# Patient Record
Sex: Male | Born: 1935 | Race: White | Hispanic: No | Marital: Married | Smoking: Former smoker
Health system: Southern US, Community
[De-identification: ages and names within clinical notes are randomized; demographics above are authoritative.]

## PROBLEM LIST (undated history)

## (undated) DIAGNOSIS — K648 Other hemorrhoids: Secondary | ICD-10-CM

## (undated) DIAGNOSIS — I2510 Atherosclerotic heart disease of native coronary artery without angina pectoris: Secondary | ICD-10-CM

## (undated) DIAGNOSIS — D509 Iron deficiency anemia, unspecified: Secondary | ICD-10-CM

## (undated) DIAGNOSIS — K449 Diaphragmatic hernia without obstruction or gangrene: Secondary | ICD-10-CM

## (undated) DIAGNOSIS — N400 Enlarged prostate without lower urinary tract symptoms: Secondary | ICD-10-CM

## (undated) DIAGNOSIS — K5790 Diverticulosis of intestine, part unspecified, without perforation or abscess without bleeding: Secondary | ICD-10-CM

## (undated) DIAGNOSIS — N529 Male erectile dysfunction, unspecified: Secondary | ICD-10-CM

## (undated) DIAGNOSIS — I10 Essential (primary) hypertension: Secondary | ICD-10-CM

## (undated) DIAGNOSIS — K219 Gastro-esophageal reflux disease without esophagitis: Secondary | ICD-10-CM

## (undated) DIAGNOSIS — E785 Hyperlipidemia, unspecified: Secondary | ICD-10-CM

## (undated) DIAGNOSIS — K222 Esophageal obstruction: Secondary | ICD-10-CM

## (undated) DIAGNOSIS — E291 Testicular hypofunction: Secondary | ICD-10-CM

## (undated) DIAGNOSIS — J189 Pneumonia, unspecified organism: Secondary | ICD-10-CM

## (undated) DIAGNOSIS — D472 Monoclonal gammopathy: Secondary | ICD-10-CM

## (undated) DIAGNOSIS — M1990 Unspecified osteoarthritis, unspecified site: Secondary | ICD-10-CM

## (undated) HISTORY — DX: Benign prostatic hyperplasia without lower urinary tract symptoms: N40.0

## (undated) HISTORY — PX: INGUINAL HERNIA REPAIR: SUR1180

## (undated) HISTORY — DX: Male erectile dysfunction, unspecified: N52.9

## (undated) HISTORY — PX: TONSILLECTOMY: SUR1361

## (undated) HISTORY — DX: Other hemorrhoids: K64.8

## (undated) HISTORY — DX: Unspecified osteoarthritis, unspecified site: M19.90

## (undated) HISTORY — DX: Iron deficiency anemia, unspecified: D50.9

## (undated) HISTORY — DX: Diverticulosis of intestine, part unspecified, without perforation or abscess without bleeding: K57.90

## (undated) HISTORY — DX: Testicular hypofunction: E29.1

## (undated) HISTORY — DX: Monoclonal gammopathy: D47.2

## (undated) HISTORY — PX: BASAL CELL CARCINOMA EXCISION: SHX1214

## (undated) HISTORY — DX: Diaphragmatic hernia without obstruction or gangrene: K44.9

## (undated) HISTORY — DX: Essential (primary) hypertension: I10

## (undated) HISTORY — DX: Atherosclerotic heart disease of native coronary artery without angina pectoris: I25.10

## (undated) HISTORY — PX: APPENDECTOMY: SHX54

## (undated) HISTORY — DX: Gastro-esophageal reflux disease without esophagitis: K21.9

## (undated) HISTORY — DX: Esophageal obstruction: K22.2

## (undated) HISTORY — DX: Hyperlipidemia, unspecified: E78.5

## (undated) HISTORY — DX: Pneumonia, unspecified organism: J18.9

---

## 1993-10-06 HISTORY — PX: CORONARY ARTERY BYPASS GRAFT: SHX141

## 2004-07-16 ENCOUNTER — Inpatient Hospital Stay (HOSPITAL_BASED_OUTPATIENT_CLINIC_OR_DEPARTMENT_OTHER): Admission: RE | Admit: 2004-07-16 | Discharge: 2004-07-16 | Payer: Self-pay | Admitting: Cardiology

## 2008-08-24 ENCOUNTER — Encounter (HOSPITAL_BASED_OUTPATIENT_CLINIC_OR_DEPARTMENT_OTHER): Payer: Self-pay | Admitting: Internal Medicine

## 2008-08-24 ENCOUNTER — Inpatient Hospital Stay (HOSPITAL_COMMUNITY): Admission: EM | Admit: 2008-08-24 | Discharge: 2008-08-25 | Payer: Self-pay | Admitting: Emergency Medicine

## 2008-08-24 ENCOUNTER — Ambulatory Visit: Payer: Self-pay | Admitting: Surgery

## 2009-01-16 ENCOUNTER — Ambulatory Visit: Payer: Self-pay | Admitting: Gastroenterology

## 2010-02-28 ENCOUNTER — Telehealth (INDEPENDENT_AMBULATORY_CARE_PROVIDER_SITE_OTHER): Payer: Self-pay

## 2010-11-07 NOTE — Progress Notes (Signed)
Summary: Schedule overdue recall colonoscopy  Phone Note Outgoing Call Call back at [facility] Phone [phone]   Call placed by: Darcey Nora RN, CGRN,  Feb 28, 2010 10:41 AM Call placed to: Patient Summary of Call: Patient was contacted about over due recall colonoscopy.  Patient  is aware he is due for a colon, but declines to schedule at this time.  He states he will call back if he is interested in scheduling.  He is given my name and number. Initial call taken by: Darcey Nora RN, CGRN,  Feb 28, 2010 10:43 AM

## 2011-02-18 NOTE — H&P (Signed)
NAMELEVORN, OLESKI NO.:  [id number]   MEDICAL RECORD NO.:  [id number]          PATIENT TYPE:  INP   LOCATION:  2016                         FACILITY:  [facility]   PHYSICIAN:  Barry Dienes. [name], M.D.DATE OF BIRTH:  28-Nov-1931   DATE OF ADMISSION:  08/23/2008  DATE OF DISCHARGE:                              HISTORY & PHYSICAL   CHIEF COMPLAINT:  Intermittent confusion.   HISTORY OF PRESENT ILLNESS:  The patient is a 77 year old right-handed  white man who yesterday had a period of confusion at church.  Around 7  o'clock continuing to about 8 p.m. he had slurred speech and was unable  to walk.  He recalls little of this event.  He did not have a loss of  consciousness or palpitations or chest pain.  He has no history of  stroke or transient ischemic attacks.  At the time of my evaluation,  several hours later he feels at his baseline.  He has had a mild  headache for the past few hours.   PAST MEDICAL HISTORY:  1. Hypertension.  2. Diabetes mellitus, type 2.  3. Coronary artery disease status post 1995 coronary artery bypass      graft x7.  4. Hyperlipidemia with statin intolerance.  5. Gastroesophageal reflux disease.   MEDICATIONS PRIOR TO ADMISSION:  (Note the doses were not available)  1. Altace 1 tablet daily.  2. Zetia 10 mg daily.  3. Actos 1 tablet daily.  4. Metformin daily.  5. Nexium 40 mg daily.   ALLERGIES:  No known drug allergies.   PAST SURGICAL HISTORY:  1. Remote tonsillectomy and adenoidectomy.  2. Bilateral inguinal hernia repair, 1995.  3. Coronary artery bypass graft x7.  4. Excision of basal cell carcinoma of the nose, 2005.  5. Cardiac catheterization done which showed severe 3-vessel native      coronary artery disease, patent saphenous vein graft to the first      and second obtuse marginal branches, patent LIMA graft to the LAD      and diagonal vessels, occluded saphenous vein graft to the right      coronary artery,  however, the right coronary system is supplied by      left-to-right collaterals, and normal left ventricular systolic      function for which medical management was recommended.   FAMILY HISTORY:  Mother died at age 68 of cancer.  Father died at age 77  of myocardial infarction.   SOCIAL HISTORY:  He is married and has 3 children.  He has worked as a  Recruitment consultant.  He stopped tobacco use approximately 14 years ago  and has no history of alcohol abuse.   REVIEW OF SYSTEMS:  Currently, he has mild headache with very mild  shortness of breath.  He denies fever, chills, productive cough, chest  pain or palpitations, nausea, vomiting, diarrhea, constipation,  arthritis, anxiety, or depression.  There are mild symptoms of benign  prostatic hypertrophy.   PHYSICAL EXAMINATION:  VITAL SIGNS:  Blood pressure 151/86, pulse 90,  respiratory rate 18, temperature 97.2, and pulse  oxygen saturation 95%  on room air.  GENERAL:  He is an elderly white man who is in no apparent distress  while sitting upright in bed.  HEAD, EYES, EARS, NOSE, AND THROAT:  Within normal limits.  NECK:  Supple without jugular venous distention or carotid bruit.  CHEST:  Clear to auscultation.  HEART:  Regular rate and rhythm without significant murmur or gallop.  ABDOMEN:  Normal bowel sounds and no hepatosplenomegaly or tenderness.  EXTREMITIES:  Without cyanosis, clubbing, or edema.  NEUROLOGIC:  He was alert and oriented x4 with a normal affect.  Cranial  nerves II-XII were normal.  Light touch sensation was intact throughout.  He had normal motor strength in all 4 limbs.  He had intact finger-to-  nose testing bilaterally.  Gait was not assessed due to his attachment  to IVs in telemetry.   LABORATORY STUDIES:  EKG showed the following:  1. Normal sinus rhythm.  2. First-degree AV block.  3. Nonspecific intraventricular conduction delay.   Urinalysis was nitrite negative.  Urine drug screen was normal.   Serum  salicylate, ethanol, and acetaminophen levels were normal.  Serum sodium  135, potassium 3.8, chloride 98, carbon dioxide 21, BUN 16, creatinine  0.92, and glucose 173.  Troponin I was less than 0.05.  White blood cell  count was 11.1, hemoglobin 12, hematocrit 35, and platelets 276.  Chest  x-ray showed status post median sternotomy, hiatal hernia, and right  upper lobe pulmonary infiltrate.  A CT scan of the head without IV  contrast showed evidence of left thalamus ischemia with remote left  middle cerebral artery distribution ischemia.   IMPRESSION AND PLAN:  1. Altered mental status:  Likely secondary to a transient ischemic      attack.  Currently, his symptoms have resolved entirely.  The      etiology could be a transient arrhythmia.  I doubt that he has an      intracardiac source of ischemia or significant carotid artery      disease.  I plan to check a transthoracic echocardiogram, carotid      ultrasound, and a brain MRI exam with MRA exam.  If this workup is      unremarkable, he may be discharged home as early as tomorrow.  2. Pneumonia:  He has virtually no symptoms of his possible early      right upper lobe pneumonia and is stable on      Rocephin with Zithromax.  3. Diabetes mellitus, type 2:  Reportedly well controlled on metformin      with Actos.  In addition, we will add NovoLog sliding scale insulin      if blood glucose levels rise greater than 250.           ______________________________  Barry Dienes. [name], M.D.     DGP/MEDQ  D:  08/24/2008  T:  08/25/2008  Job:  [id number]   cc:   Gwen Pounds, MD  Peter M. [place], M.D.

## 2011-02-21 NOTE — Cardiovascular Report (Signed)
NAME:  Gabriel Lewis, ROMER NO.:  [id number]   MEDICAL RECORD NO.:  [id number]          PATIENT TYPE:  OIB   LOCATION:  6501                         FACILITY:  [facility]   PHYSICIAN:  Peter M. [place], M.D.  DATE OF BIRTH:  [birth year]   DATE OF PROCEDURE:  07/16/2004  DATE OF DISCHARGE:                              CARDIAC CATHETERIZATION   INDICATIONS FOR PROCEDURE:  A 77 year old male with history of diabetes,  hypertension, hyperlipidemia. History of coronary artery bypass surgery 10  years ago now presents with recurrent angina.  Stress Cardiolite study  demonstrates inferolateral ischemia.   PROCEDURES:  Left heart catheterization of coronary, left ventricular  angiography, saphenous vein graft and LIMA graft angiography.  Access would  be the right femoral artery using standard Seldinger technique.   EQUIPMENT:  Used 4-French 4 cm right and left Judkins catheter, a 4-French  pigtail catheter, a 4-French LCV catheter and 4-French LIMA catheter and a 4-  French arterial sheath.   MEDICATIONS:  1.  Local anesthesia 1% Xylocaine.  2.  Contrast:  150 cc of Omnipaque.   HEMODYNAMIC DATA:  Aortic pressure 147/76 with mean of 107, left ventricular  pressure 149/with EDP of 15 mmHg.   ANGIOGRAPHIC DATA:  1.  The left coronary artery rises and distributes normally.  The left main      coronary artery appears normal.  The left anterior descending artery is      occluded after the takeoff of first septal perforator.  2.  The left circumflex coronary is occluded proximally.  3.  The right coronary artery is occluded proximally.  4.  The saphenous vein graft to the right coronary artery is occluded      proximally.  5.  Saphenous vein graft to the first and second obtuse marginal vessels is      widely patent.  There is mild 20-30% disease in the distal vein graft.      There is excellent runoff.  6.  The LIMA graft to LAD is widely patent.  Of note, there are excellent    left and right collaterals both from the marginal vessels and the LAD to      the right coronary artery.  7.  Left ventricular angiography demonstrates normal left ventricular size      with inferior basal hypokinesia.  Overall ejection fraction is estimated      at 55%.  There is no mitral regurgitation or prolapse.   FINAL INTERPRETATION:  1.  Severe three vessel occlusive atherosclerotic coronary artery disease.  2.  Patent saphenous vein graft to the first and second obtuse marginal      vessels.  3.  Patent LIMA graft to LAD and diagonal vessels.  4.  Occluded saphenous vein graft to the right coronary artery.  The right      coronary, however, is supplied by good      left to right collaterals.  5.  Normal left ventricular function.   PLAN:  Recommend continue medical therapy.       PMJ/MEDQ  D:  07/16/2004  T:  07/16/2004  Job:  [id number]   cc:   Gwen Pounds, M.D.  Fax: [phone]

## 2011-02-21 NOTE — Discharge Summary (Signed)
Gabriel Lewis, CROSWELL NO.:  [id number]   MEDICAL RECORD NO.:  [id number]          PATIENT TYPE:  INP   LOCATION:  2016                         FACILITY:  [facility]   PHYSICIAN:  Barry Dienes. [name], M.D.DATE OF BIRTH:  26-Nov-1931   DATE OF ADMISSION:  08/23/2008  DATE OF DISCHARGE:  08/25/2008                               DISCHARGE SUMMARY   PERTINENT FINDINGS:  The patient is a 77 year old right-handed white man  who on the day prior to admission had a period of confusion at church.  Around 7 o'clock, continuing until about 8 p.m., he had slurred speech  and was unable to walk.  He recalled little of this event.  He did not  have a loss of consciousness or palpitations or chest pain.  He has no  history of stroke or transient ischemic attacks.  At the time of my  evaluation, he felt back at his baseline.  He notes that he has also had  a mild headache for the past few hours.   PAST MEDICAL HISTORY:  1. Hypertension.  2. Diabetes mellitus, type 2.  3. Coronary artery disease, status post 1995 CABG x7.  4. Hyperlipidemia with statin intolerance.  5. Gastroesophageal reflux disease.   MEDICATIONS PRIOR TO ADMISSION:  1. Altace 1 tablet daily.  2. Zetia 10 mg daily.  3. Actos 1 tablet daily.  4. Metformin daily.  5. Nexium 40 mg daily.  6. Aspirin 81 mg occasionally.   ALLERGIES:  No known drug allergies.   PHYSICAL EXAMINATION:  VITAL SIGNS:  Blood pressure 151/86, pulse 90,  respirations 18, temperature 97.2, and pulse oxygen saturation 95% on  room air.  GENERAL:  He is an elderly white man who was in no apparent distress  while sitting upright in bed.  HEAD, EYES, EARS, NOSE, AND THROAT:  Within normal limits without facial  droop.  NECK:  Supple without jugular venous distention or carotid bruit.  CHEST:  Clear to auscultation.  HEART:  Irregular rate and rhythm without significant murmur or gallop.  ABDOMEN:  Normal bowel sounds and no  hepatosplenomegaly or tenderness.  EXTREMITIES:  Without cyanosis, clubbing, or edema and the pedal pulses  were normal.  NEUROLOGIC:  He was alert and oriented x4 with a normal affect.  Cranial  nerves II through XII were normal, light touch sensation was intact  throughout.  He had normal motor strength in all limbs.  He had intact  finger-to-nose testing bilaterally.  Gait was not assessed initially.   INITIAL LABORATORY STUDIES:  EKG showed the following:  1. Normal sinus rhythm.  2. First-degree AV block,  3. Nonspecific intraventricular conduction delay.   Urinalysis was nitrite negative.  Urine drug screen was normal.  Serum  salicylate, ethanol, and acetaminophen levels were normal.  Serum sodium  135, potassium 3.8, chloride 98, carbon dioxide 21, BUN 16, creatinine  0.92, and glucose 173.  Troponin I less than 0.05.  White blood cell  count 11.1, hemoglobin 12, hematocrit 35, and platelets 276.  Chest x-  ray showed status post median sternotomy, hiatal hernia, and  early right  upper lobe pulmonary infiltrate.  A CT scan of the head without IV  contrast showed evidence of left thalamus ischemia with remote left  middle cerebral artery distribution ischemia.   HOSPITAL COURSE:  The patient was admitted to a medical bed with  telemetry.  He also was started on Rocephin and Zithromax for mild  community-acquired pneumonia.  On telemetry, he showed no significant  arrhythmia.  A transthoracic echocardiogram showed normal left  ventricular size and systolic function with an ejection fraction of  approximately 60%.  He had mild concentric hypertrophy and no evidence  of valvular heart disease.  He also had a MRA of the head without  contrast that showed no occlusions, stenoses, dissections, or aneurysms  in the circle of Willis and main cerebral artery.  A head MRI scan  without and with contrast showed no evidence of acute ischemia, mild  diffuse atrophy, nonspecific subcortical  white matter changes  supratentorially likely due to ischemic gliosis due to small vessel  disease, and less likely due to a demyelinating illness or vasculitis,  mild inflammatory thickening of the mucosa in the ethmoid sinuses and  maxillary sinuses.  A CT scan of the head without contrast showed a  subtle hypodensity in the left thalamus compatible with age  indeterminate small vessel ischemia and left insula and operculum  encephalomalacia, probably due to remote left middle cerebral artery  distribution ischemia.  He also had a carotid ultrasound exam with  preliminary report showing no evidence of significant stenoses.   COMPLICATIONS:  None.   CONDITION ON DISCHARGE:  He felt fine and not had any further transient  ischemic attacks since admission.  He has been eating well and has not  had shortness of breath, chest pain or palpitations, or nausea.   PHYSICAL EXAMINATION:  VITAL SIGNS:  Blood pressure 133/84, pulse 72,  respirations 22, temperature 97.7, and pulse oxygen saturation 96% on  room air.  GENERAL:  He is a well-nourished, well-developed white male who is in no  apparent distress.  CHEST:  Clear to auscultation.  HEART:  Regular rate and rhythm.  NEUROLOGIC:  Nonfocal.  He was able to change from a sitting position to  a standing position and walk in his room and turnaround and return to a  seated position without any difficulty.   DISCHARGE DIAGNOSES:  1. Transient ischemic attack without evidence of acute stroke.  2. Right upper lobe pneumonia.  3. Hyperlipidemia.  4. Hypertension.  5. Diabetes mellitus, type 2, controlled.  6. Gastroesophageal reflux disease.  7. Coronary artery disease.   DISCHARGE MEDICATIONS:  1. Zetia 10 mg p.o. daily.  2. Altace 2.5 mg p.o. daily.  3. Norvasc 2.5 mg p.o. daily.  4. Metformin ER 500 mg p.o. b.i.d.  5. Fish oil 1200 mg p.o. t.i.d.  6. Nexium 40 mg p.o. daily.  7. Toprol 1 tablet daily.  8. Ecotrin 325 mg p.o.  daily.  9. Doxycycline 100 mg p.o. b.i.d. for 10 days.   DISPOSITION AND FOLLOWUP:  The patient will be discharged to home with  his wife.  He is to see his primary physician, Dr. Creola Corn, next week  at [facility] as previously planned.           ______________________________  Barry Dienes. [name], M.D.     DGP/MEDQ  D:  08/27/2008  T:  08/28/2008  Job:  [id number]

## 2011-02-21 NOTE — H&P (Signed)
NAME:  Gabriel Lewis, Gabriel Lewis NO.:  [id number]   MEDICAL RECORD NO.:  [id number]          PATIENT TYPE:  AMB   LOCATION:                               FACILITY:  [facility]   PHYSICIAN:  Peter M. [place], M.D.  DATE OF BIRTH:  08-May-1932   DATE OF ADMISSION:  07/16/2004  DATE OF DISCHARGE:                                HISTORY & PHYSICAL   HISTORY OF PRESENT ILLNESS:  Gabriel Lewis is a 77 year old white male with a  history of coronary artery disease.  He is status post coronary artery  bypass surgery x 7 in 1995 by Dr. Andrey Campanile.  He had had a negative Cardiolite  study in 2003.  He presents with a 2 to 3 week history of left elbow pain.  He awoke one morning with fairly severe elbow pain but this subsequently  eased off.  He has had no nausea, vomiting, diaphoresis, shortness of  breath, or any significant chest pain.  He subsequently underwent a stress  Cardiolite study on July 09, 2004.  He was able to walk eight and a half  minutes on the Bruce protocol.  He did develop typical left elbow pain and  left chest tightness, occasional PVCs were noted.  He had no ST segment  changes.  Cardiolite images demonstrated basal inferior lateral wall  ischemia.  Left ventricular function was overall normal with an ejection  fraction of 64%.  This ischemic abnormality was new compared to 2003.  The  patient is now admitted for cardiac catheterization.   PAST MEDICAL HISTORY:  1.  Hyperlipidemia.  2.  Hypertension.  3.  Borderline diabetes mellitus.  4.  He has had prior appendectomy.  5.  Tonsillectomy.  6.  Adenoidectomy.  7.  Bilateral hernia repair.  8.  Previous coronary bypass surgery.   CURRENT MEDICATIONS:  1.  Altace 2.5 mg daily.  2.  Zetia 10 mg per day.  3.  Aspirin 81 mg per day.  4.  Avandia 4 mg per day.  5.  Welchol 625 mg, six tablets a day.  6.  Vision formula once a day.  7.  Prilosec 20 mg per day.  8.  Ditropan 5 mg p.r.n.   ALLERGIES:  The patient is  intolerant to MULTIPLE STATINS due to myalgias.   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  He is  married.  He has three children.  He quit smoking ten years ago at the time  of his bypass surgery.  He denies alcohol use.   FAMILY HISTORY:  Father died at age 85 with myocardial infarction.  Mother  died age 77 with cancer.  He has a sister who is alive and well.   REVIEW OF SYSTEMS:  The patient has had no TIA or CVA symptoms.  No nausea,  vomiting.  Bowels have been regular.  He has had on kidney dysfunction.  No  edema, orthopnea, or PND.  Other review of systems are negative.   PHYSICAL EXAMINATION:  GENERAL:  The patient is a pleasant white male in no  distress.  VITAL SIGNS:  Weight is 193, blood pressure is 140/80, pulse 88 and regular,  respirations are normal.  HEENT:  Unremarkable.  He has no JVD, adenopathy, thyromegaly, or bruits.  LUNGS:  Clear.  CARDIAC:  Reveals a regular rate and rhythm.  Normal S1 and S2 without  gallop, murmur, rub, or click.  He has an old median sternotomy scar.  ABDOMEN:  Abdomen is soft and nontender without masses, bruits, or  hepatosplenomegaly.  EXTREMITIES:  Without edema.  Pulses are 2+ and symmetric.  NEUROLOGIC:  Nonfocal.   LABORATORY DATA:  Chest x-ray shows chronic postoperative change from prior  bypass surgery.  No active disease.   ECG shows normal sinus rhythm with a right bundle branch block.   IMPRESSION:  1.  Recurrent angina pectoris.  2.  Atherosclerotic coronary artery disease, status post coronary artery      bypass graft times seven in 1995.  Bypass grafts at that time included:      left internal mammary artery graft sequentially to the left anterior      descending artery and diagonal; a sequential saphenous vein graft to the      acute marginal, posterior descending, and posterior lateral branches of      the right coronary artery; and a saphenous vein graft sequential to the       circumflex and obtuse marginal vessel.  3.  Hypertension.  4.  Diabetes mellitus, type 2.  5.  Hypercholesterolemia.  6.  History of intolerance to statin drugs.   PLAN:  Will undergo cardiac catheterization with further therapy pending  these results.        ___________________________________________  Peter M. [place], M.D.    PMJ/MEDQ  D:  07/12/2004  T:  07/12/2004  Job:  [id number]   cc:   Gwen Pounds, M.D.  Fax: [phone]

## 2011-04-02 ENCOUNTER — Encounter: Payer: Self-pay | Admitting: Gastroenterology

## 2011-04-02 ENCOUNTER — Ambulatory Visit (INDEPENDENT_AMBULATORY_CARE_PROVIDER_SITE_OTHER): Payer: Medicare Other | Admitting: Gastroenterology

## 2011-04-02 VITALS — BP 134/72 | HR 80 | Ht 69.0 in | Wt 185.0 lb

## 2011-04-02 DIAGNOSIS — D509 Iron deficiency anemia, unspecified: Secondary | ICD-10-CM

## 2011-04-02 DIAGNOSIS — K219 Gastro-esophageal reflux disease without esophagitis: Secondary | ICD-10-CM

## 2011-04-02 MED ORDER — PEG-KCL-NACL-NASULF-NA ASC-C 100 G PO SOLR: 1.0000 | Freq: Once | ORAL | Status: DC

## 2011-04-02 NOTE — Patient Instructions (Signed)
Go directly to the basement to have your labs drawn today.  You have been scheduled for a Upper Endoscopy/ Colonoscopy with propofol. Separate instructions given. Pick up your prep from your pharmacy.  cc: Creola Corn, MD

## 2011-04-02 NOTE — Progress Notes (Signed)
History of Present Illness: This is a 77 year old male here today with his wife. He is referred by Dr. Timothy Lasso for a recurrent microcytic anemia, presumed to be iron deficiency. He has a history of iron deficiency anemia about 35 years ago when he was followed by Dr. Jeanie Sewer. I saw him for iron deficiency anemia in 2005. His colonoscopy was unremarkable, except for fair bowel preparation, and he had an esophageal stricture, a gastric ulcer, duodenitis, a hiatal hernia and Cameron erosions. Gastric ulcer was documented to heal on a followup endoscopy in 3 months later. His reflux symptoms are under good control on daily Prilosec. He has no gastrointestinal complaints except for the recent onset of constipation that he relates to [employer]. His constipation has improved since he discontinued WelChol and start MiraLax. Recent hemoglobin was 10.9 with an MCV of 75.6. He denies change in stool caliber, melena, hematochezia, weight loss, chest pain, abdominal pain, nausea, vomiting, dysphagia.  Past Medical History  Diagnosis Date  . Diverticulosis   . Internal hemorrhoids   . Gallstones   . Diabetes mellitus   . CAD (coronary artery disease)   . Hypertension   . BPH (benign prostatic hyperplasia)   . ED (erectile dysfunction)   . Hyperlipidemia   . Arthritis   . Iron deficiency anemia   . Esophageal stricture   . Hiatal hernia   . Gastric ulcer   . Hypogonadism male   . TIA (transient ischemic attack)   . Pneumonia   . GERD (gastroesophageal reflux disease)    Past Surgical History  Procedure Date  . Appendectomy   . Tonsillectomy   . Coronary artery bypass graft   . Inguinal hernia repair     Bilateral  . Basal cell carcinoma excision     reports that he quit smoking about 17 years ago. He has never used smokeless tobacco. He reports that he does not drink alcohol or use illicit drugs. family history includes Heart attack in his father.  There is no history of Colon cancer. No Known  Allergies  Outpatient Encounter Prescriptions as of 04/02/2011  Medication Sig Dispense Refill  . amLODipine (NORVASC) 5 MG tablet Take 5 mg by mouth daily.        Marland Kitchen arginine 500 MG tablet Take 500 mg by mouth daily.        Marland Kitchen aspirin 325 MG tablet Take 325 mg by mouth daily.        . Cholecalciferol (VITAMIN D3) 1000 UNITS CAPS Take 1 capsule by mouth daily.        . Cinnamon 500 MG capsule Take 500 mg by mouth daily.        Marland Kitchen ezetimibe (ZETIA) 10 MG tablet Take 10 mg by mouth daily.        . fish oil-omega-3 fatty acids 1000 MG capsule Take 2 g by mouth 3 (three) times daily.        . Garlic TABS Take 1 tablet by mouth daily.        . Melatonin 3 MG CAPS Take 1 capsule by mouth daily.        . metFORMIN (GLUCOPHAGE) 500 MG tablet Take 1,000 mg by mouth 2 (two) times daily with a meal.        . Multiple Vitamins-Minerals (VISION FORMULA) TABS Take 1 tablet by mouth daily.        Marland Kitchen omeprazole (PRILOSEC) 20 MG capsule Take 20 mg by mouth daily.        Marland Kitchen oxybutynin (  DITROPAN-XL) 5 MG 24 hr tablet Take 5 mg by mouth. As needed      . pioglitazone (ACTOS) 30 MG tablet Take 30 mg by mouth daily.        . polyethylene glycol powder (MIRALAX) powder Take 17 g by mouth. As needed       . Tamsulosin HCl (FLOMAX) 0.4 MG CAPS Take 0.4 mg by mouth daily.        Marland Kitchen zolpidem (AMBIEN) 5 MG tablet Take 5 mg by mouth at bedtime as needed.        . colesevelam (WELCHOL) 625 MG tablet Take 1,875 mg by mouth 2 (two) times daily with a meal.        . peg 3350 powder (MOVIPREP) 100 G SOLR Take 1 kit (100 g total) by mouth once.  1 kit  0    Review of Systems: Pertinent positive and negative review of systems were noted in the above HPI section. All other review of systems were otherwise negative.  Physical Exam: General: Well developed , well nourished, no acute distress Head: Normocephalic and atraumatic Eyes:  sclerae anicteric, EOMI Ears: Normal auditory acuity Mouth: No deformity or lesions Neck: Supple,  no masses or thyromegaly Lungs: Clear throughout to auscultation Heart: Regular rate and rhythm; no murmurs, rubs or bruits Abdomen: Soft, non tender and non distended. No masses, hepatosplenomegaly or hernias noted. Normal Bowel sounds Rectal: Deferred to colonoscopy Musculoskeletal: Symmetrical with no gross deformities  Skin: No lesions on visible extremities Pulses:  Normal pulses noted Extremities: No clubbing, cyanosis, edema or deformities noted Neurological: Alert oriented x 4, grossly nonfocal Cervical Nodes:  No significant cervical adenopathy Inguinal Nodes: No significant inguinal adenopathy Psychological:  Alert and cooperative. Normal mood and affect  Assessment and Recommendations:  1. Recurrent iron deficiency anemia. I suspect he has chronic losses from [facility] erosions related to his hiatal hernia. This is a chronic condition which may cause iron deficiency intermittently. Obtain iron studies and celiac disease antibodies. Rule out colorectal neoplasms, ulcer disease, AVMs and other disorders. Schedule colonoscopy and upper endoscopy. The risks, benefits, and alternatives to colonoscopy with possible biopsy and possible polypectomy were discussed with the patient and they consent to proceed. The risks, benefits, and alternatives to endoscopy with possible biopsy and possible dilation were discussed with the patient and they consent to proceed.   2. GERD with a history of peptic stricture. Omeprazole 20 mg daily.  3. History of gastric ulcers. Continue omeprazole 20 mg daily.

## 2011-04-03 ENCOUNTER — Encounter: Payer: Self-pay | Admitting: Gastroenterology

## 2011-04-03 ENCOUNTER — Other Ambulatory Visit (INDEPENDENT_AMBULATORY_CARE_PROVIDER_SITE_OTHER): Payer: Medicare Other

## 2011-04-03 ENCOUNTER — Telehealth: Payer: Self-pay

## 2011-04-03 DIAGNOSIS — D509 Iron deficiency anemia, unspecified: Secondary | ICD-10-CM

## 2011-04-03 DIAGNOSIS — K219 Gastro-esophageal reflux disease without esophagitis: Secondary | ICD-10-CM

## 2011-04-03 LAB — IBC PANEL
Saturation Ratios: 6.9 % — ABNORMAL LOW (ref 20.0–50.0)
Transferrin: 370.2 mg/dL — ABNORMAL HIGH (ref 212.0–360.0)

## 2011-04-03 MED ORDER — FERROUS SULFATE 325 (65 FE) MG PO TABS: 325.0000 mg | ORAL_TABLET | Freq: Two times a day (BID) | ORAL | Status: DC

## 2011-04-03 NOTE — Telephone Encounter (Signed)
Message copied by Jessee Avers on Thu Apr 03, 2011  2:48 PM ------      Message from: Claudette Head T      Created: Thu Apr 03, 2011  2:12 PM       Fe def anemia      Begin FeSO4 325 mg po bid with meals and hold for 5 days before colonoscopy

## 2011-04-03 NOTE — Telephone Encounter (Signed)
Spoke with patient and informed him that has iron deficiency anemia and he needs to start a iron supplement twice daily with meals. Pt agreed and verbalized understanding. Iron sent to the pharmacy.

## 2011-04-04 LAB — CELIAC PANEL 10
Endomysial Screen: NEGATIVE
Gliadin IgA: 5.9 U/mL (ref <20)
Gliadin IgG: 1.9 U/mL (ref <20)
IgA: 723 mg/dL — ABNORMAL HIGH (ref 68–379)

## 2011-05-07 ENCOUNTER — Ambulatory Visit (AMBULATORY_SURGERY_CENTER): Payer: Medicare Other | Admitting: Gastroenterology

## 2011-05-07 ENCOUNTER — Encounter: Payer: Self-pay | Admitting: Gastroenterology

## 2011-05-07 DIAGNOSIS — K219 Gastro-esophageal reflux disease without esophagitis: Secondary | ICD-10-CM

## 2011-05-07 DIAGNOSIS — D509 Iron deficiency anemia, unspecified: Secondary | ICD-10-CM

## 2011-05-07 LAB — GLUCOSE, CAPILLARY: Glucose-Capillary: 139 mg/dL — ABNORMAL HIGH (ref 70–99)

## 2011-05-07 MED ORDER — SODIUM CHLORIDE 0.9 % IV SOLN: 500.0000 mL | INTRAVENOUS | Status: DC

## 2011-05-07 NOTE — Patient Instructions (Signed)
FOLLOW DISCHARGE INSTRUCTIONS (BLUE & GREEN SHEETS)  INFORMATION GIVEN ON DIVERTICULOSIS ,HEMORRHOIDS, GASTRITIS, & HIATAL HERNIA, AND ANTI REFLUX REGIMEN.   CONTINUE YOUR ANTI REFLUX MEDICATION  FOLLOW  HIGH FIBER DIET WITH LIBERAL FLUID INTAKE.

## 2011-05-08 ENCOUNTER — Telehealth: Payer: Self-pay

## 2011-05-08 NOTE — Telephone Encounter (Signed)

## 2011-07-09 LAB — COMPREHENSIVE METABOLIC PANEL
ALT: 13
AST: 18
Albumin: 3.5
CO2: 27
Calcium: 9.2
GFR calc Af Amer: >60
GFR calc non Af Amer: >60
Sodium: 135

## 2011-07-09 LAB — POCT I-STAT, CHEM 8
BUN: 18
Chloride: 98
Creatinine, Ser: 1.2
Potassium: 3.9
Sodium: 136

## 2011-07-09 LAB — URINALYSIS, ROUTINE W REFLEX MICROSCOPIC
Glucose, UA: NEGATIVE
Ketones, ur: 15 — AB
Protein, ur: 30 — AB
pH: 5

## 2011-07-09 LAB — CBC
HCT: 35.4 — ABNORMAL LOW
Hemoglobin: 10.3 — ABNORMAL LOW
Hemoglobin: 12 — ABNORMAL LOW
MCHC: 33.9
Platelets: 250
RDW: 14.3
RDW: 14.6

## 2011-07-09 LAB — URINE MICROSCOPIC-ADD ON

## 2011-07-09 LAB — RAPID URINE DRUG SCREEN, HOSP PERFORMED
Amphetamines: NOT DETECTED
Benzodiazepines: NOT DETECTED
Cocaine: NOT DETECTED

## 2011-07-09 LAB — DIFFERENTIAL
Eosinophils Absolute: 0
Eosinophils Relative: 0
Lymphs Abs: 1.7
Monocytes Absolute: 1.4 — ABNORMAL HIGH
Monocytes Relative: 12

## 2011-07-09 LAB — PROTIME-INR: Prothrombin Time: 14

## 2011-07-09 LAB — GLUCOSE, CAPILLARY
Glucose-Capillary: 138 — ABNORMAL HIGH
Glucose-Capillary: 167 — ABNORMAL HIGH
Glucose-Capillary: 183 — ABNORMAL HIGH

## 2011-07-09 LAB — POCT CARDIAC MARKERS
CKMB, poc: 1.1
Myoglobin, poc: 90.9

## 2011-07-09 LAB — CULTURE, BLOOD (ROUTINE X 2)

## 2011-07-09 LAB — ETHANOL: Alcohol, Ethyl (B): <5

## 2012-01-14 ENCOUNTER — Encounter: Payer: Self-pay | Admitting: Cardiology

## 2012-01-14 ENCOUNTER — Ambulatory Visit (INDEPENDENT_AMBULATORY_CARE_PROVIDER_SITE_OTHER): Payer: Medicare Other | Admitting: Cardiology

## 2012-01-14 VITALS — BP 150/62 | HR 67 | Ht 69.0 in | Wt 183.8 lb

## 2012-01-14 DIAGNOSIS — E785 Hyperlipidemia, unspecified: Secondary | ICD-10-CM

## 2012-01-14 DIAGNOSIS — R011 Cardiac murmur, unspecified: Secondary | ICD-10-CM

## 2012-01-14 DIAGNOSIS — I10 Essential (primary) hypertension: Secondary | ICD-10-CM

## 2012-01-14 DIAGNOSIS — I2510 Atherosclerotic heart disease of native coronary artery without angina pectoris: Secondary | ICD-10-CM

## 2012-01-14 DIAGNOSIS — E119 Type 2 diabetes mellitus without complications: Secondary | ICD-10-CM

## 2012-01-14 DIAGNOSIS — Z951 Presence of aortocoronary bypass graft: Secondary | ICD-10-CM

## 2012-01-14 NOTE — Progress Notes (Signed)
Evorn Gong Date of Birth: 1932-05-16 Medical Record #[id number]  History of Present Illness: Mr. Kinzler is seen at the request of Dr. Timothy Lasso to reestablish cardiac care. He is a pleasant 78 year old white male who has a known history of coronary disease. He underwent coronary bypass surgery in 1995 by Dr. Andrey Campanile. This included an LIMA graft to the LAD, saphenous vein graft sequentially to the first and second obtuse marginal vessels, and saphenous vein graft to the right coronary. He was last seen in 2005. At that time he experienced some shoulder and arm discomfort. A stress Myoview study demonstrating inferior basal ischemia with normal ejection fraction. He was able to walk a half minutes on Bruce protocol. Subsequent cardiac catheterization demonstrated occlusion of the vein graft to the right coronary which was supplied by good collateral flow. His other grafts were patent. Since that time he has been lost to followup. He reports that he has done well. He denies any significant chest pain or shortness of breath. He's had no palpitations. His wife reports that on a recent mission trip to [place] he got sick and there was concern that he appeared very pale and short of breath.  Current Outpatient Prescriptions on File Prior to Visit  Medication Sig Dispense Refill  . amLODipine (NORVASC) 5 MG tablet Take 5 mg by mouth daily.        Marland Kitchen arginine 500 MG tablet Take 500 mg by mouth daily.        Marland Kitchen aspirin 325 MG tablet Take 81 mg by mouth daily.       . Cholecalciferol (VITAMIN D3) 1000 UNITS CAPS Take 1 capsule by mouth daily.        . Cinnamon 500 MG capsule Take 500 mg by mouth daily.        . colesevelam (WELCHOL) 625 MG tablet Take 1,875 mg by mouth 2 (two) times daily as needed.       . ezetimibe (ZETIA) 10 MG tablet Take 10 mg by mouth daily.        . fish oil-omega-3 fatty acids 1000 MG capsule Take 2 g by mouth 3 (three) times daily.        . Garlic TABS Take 1 tablet by mouth daily.         . Melatonin 3 MG CAPS Take 1 capsule by mouth daily.        . metFORMIN (GLUCOPHAGE) 500 MG tablet Take 1,000 mg by mouth 2 (two) times daily with a meal.        . Multiple Vitamins-Minerals (VISION FORMULA) TABS Take 1 tablet by mouth daily.        Marland Kitchen omeprazole (PRILOSEC) 20 MG capsule Take 20 mg by mouth daily.        Marland Kitchen oxybutynin (DITROPAN-XL) 5 MG 24 hr tablet Take 5 mg by mouth. As needed      . pioglitazone (ACTOS) 30 MG tablet Take 30 mg by mouth daily.        . polyethylene glycol powder (MIRALAX) powder Take 17 g by mouth. As needed       . Tamsulosin HCl (FLOMAX) 0.4 MG CAPS Take 0.4 mg by mouth daily.        Marland Kitchen zolpidem (AMBIEN) 5 MG tablet Take 5 mg by mouth at bedtime as needed.         Current Facility-Administered Medications on File Prior to Visit  Medication Dose Route Frequency Provider Last Rate Last Dose  . 0.9 %  sodium  chloride infusion  500 mL Intravenous Continuous Meryl Dare, MD,FACG        Allergies  Allergen Reactions  . Statins Other (See Comments)    myalgias    Past Medical History  Diagnosis Date  . Diverticulosis   . Internal hemorrhoids   . Diabetes mellitus   . CAD (coronary artery disease)   . Hypertension   . BPH (benign prostatic hyperplasia)   . ED (erectile dysfunction)   . Hyperlipidemia   . Arthritis   . Iron deficiency anemia   . Esophageal stricture   . Hiatal hernia   . Gastric ulcer   . Hypogonadism male   . Pneumonia   . GERD (gastroesophageal reflux disease)     Past Surgical History  Procedure Date  . Appendectomy   . Tonsillectomy   . Coronary artery bypass graft 1995    LIMA-LAD, svg-OM1-2, svg-rca  . Inguinal hernia repair     Bilateral  . Basal cell carcinoma excision     History  Smoking status  . Former Smoker  . Quit date: 10/06/1993  Smokeless tobacco  . Never Used    History  Alcohol Use No    Family History  Problem Relation Age of Onset  . Heart attack Father     Died at age 4.  .  Colon cancer Neg Hx     Review of Systems: The review of systems is positive for chronic insomnia. He states he doesn't walk as much as before but still gets out and mows his yard with a push mower. He does have a history of anemia. He underwent upper and lower endoscopy this past year by Dr. Russella Dar. He has a large hiatal hernia with mild gastritis. He was noted to have some diverticuli and hemorrhoids. All other systems were reviewed and are negative.  Physical Exam: BP 150/62  Pulse 67  Ht 5\' 9"  (1.753 m)  Wt 183 lb 12.8 oz (83.371 kg)  BMI 27.14 kg/m2 He is a well-developed, elderly male in no acute distress. His HEENT exam is unremarkable. Pupils are equal round and reactive to light accommodation. Extraocular movements are full. Oropharynx is clear. Neck is supple without JVD, adenopathy, thyromegaly, or bruits. Lungs are clear. Cardiac exam reveals a regular rate and rhythm. There is a harsh grade 1-2/6 systolic ejection murmur in the right upper sternal border. There is a softer blowing systolic murmur at the apex. Abdomen is soft and nontender without masses or hepatosplenomegaly. Femoral and pedal pulses are 2+. He has no cyanosis or edema. Skin is warm and dry. He is alert and oriented x3. Cranial nerves II through XII are intact. LABORATORY DATA: ECG today demonstrates normal sinus rhythm with first degree AV block and a right bundle branch block. No old ECGs for comparison.  Assessment / Plan:

## 2012-01-14 NOTE — Assessment & Plan Note (Signed)
He is intolerant to statin therapy. He is on Zetia, WelChol, and fish oil.

## 2012-01-14 NOTE — Assessment & Plan Note (Signed)
Mr. Difatta really seems to be doing fairly well from a cardiac standpoint. He is now 18 years status post CABG. I recommended a followup stress Myoview study to compare with the prior study. I anticipate it will demonstrate some inferior wall ischemia but if it is stable we will continue with medical therapy.

## 2012-01-14 NOTE — Assessment & Plan Note (Signed)
His exam is most consistent with some aortic valve sclerosis/stenosis and possible mild mitral insufficiency. We will obtain an echocardiogram to evaluate further.

## 2012-01-14 NOTE — Patient Instructions (Signed)
We will schedule you for a nuclear stress test and an echocardiogram  Continue your current medication  We will ask Dr. Timothy Lasso for a copy of your last lab work

## 2012-01-26 ENCOUNTER — Ambulatory Visit (HOSPITAL_COMMUNITY): Payer: Medicare Other | Attending: Internal Medicine | Admitting: Radiology

## 2012-01-26 VITALS — BP 142/80 | Ht 69.0 in | Wt 178.0 lb

## 2012-01-26 DIAGNOSIS — I10 Essential (primary) hypertension: Secondary | ICD-10-CM | POA: Insufficient documentation

## 2012-01-26 DIAGNOSIS — Z87891 Personal history of nicotine dependence: Secondary | ICD-10-CM | POA: Insufficient documentation

## 2012-01-26 DIAGNOSIS — Z8673 Personal history of transient ischemic attack (TIA), and cerebral infarction without residual deficits: Secondary | ICD-10-CM | POA: Insufficient documentation

## 2012-01-26 DIAGNOSIS — E785 Hyperlipidemia, unspecified: Secondary | ICD-10-CM | POA: Insufficient documentation

## 2012-01-26 DIAGNOSIS — R0602 Shortness of breath: Secondary | ICD-10-CM | POA: Insufficient documentation

## 2012-01-26 DIAGNOSIS — Z951 Presence of aortocoronary bypass graft: Secondary | ICD-10-CM | POA: Insufficient documentation

## 2012-01-26 DIAGNOSIS — E119 Type 2 diabetes mellitus without complications: Secondary | ICD-10-CM | POA: Insufficient documentation

## 2012-01-26 DIAGNOSIS — Z8249 Family history of ischemic heart disease and other diseases of the circulatory system: Secondary | ICD-10-CM | POA: Insufficient documentation

## 2012-01-26 DIAGNOSIS — R079 Chest pain, unspecified: Secondary | ICD-10-CM

## 2012-01-26 DIAGNOSIS — R011 Cardiac murmur, unspecified: Secondary | ICD-10-CM | POA: Insufficient documentation

## 2012-01-26 DIAGNOSIS — I2510 Atherosclerotic heart disease of native coronary artery without angina pectoris: Secondary | ICD-10-CM | POA: Insufficient documentation

## 2012-01-26 MED ORDER — TECHNETIUM TC 99M TETROFOSMIN IV KIT
33.0000 | PACK | Freq: Once | INTRAVENOUS | Status: AC | PRN
  Administered 2012-01-26: 33 via INTRAVENOUS

## 2012-01-26 MED ORDER — TECHNETIUM TC 99M TETROFOSMIN IV KIT
11.0000 | PACK | Freq: Once | INTRAVENOUS | Status: AC | PRN
  Administered 2012-01-26: 11 via INTRAVENOUS

## 2012-01-26 NOTE — Progress Notes (Signed)
[facility] SITE 3 NUCLEAR MED [address] [place] [address] [phone]  Cardiology Nuclear Med Study  Gabriel Lewis is a 78 y.o. male     MRN : [id number]     DOB: Mar 12, 1932  Procedure Date: 01/26/2012  Nuclear Med Background Indication for Stress Test:  Evaluation for Ischemia and Graft Patency History:  95' CABG x7,09'ECHO EF:55-60 nml,05' Heart Catheterization EF 55% occ.saph.grafttp RCA good collaterals, 05' Myocardial Perfusion Study -inf basal isch.murmur,CAD Cardiac Risk Factors: Family History - CAD, History of Smoking, Lipids, NIDDM and TIA  Symptoms:  SOB   Nuclear Pre-Procedure Caffeine/Decaff Intake:  None NPO After: 8:00pm   Lungs:  clear O2 Sat: 95% on room air. IV 0.9% NS with Angio Cath:  20g  IV Site: R Hand  IV Started by:  Cathlyn Parsons, RN  Chest Size (in):  44 Cup Size: n/a  Height: 5\' 9"  (1.753 m)  Weight:  178 lb (80.74 kg)  BMI:  Body mass index is 26.29 kg/(m^2). Tech Comments:  No Metformin or Actos    Nuclear Med Study 1 or 2 day study: 1 day  Stress Test Type:  Stress  Reading MD: Dietrich Pates, MD  Order Authorizing Provider:  Vonna Drafts  Resting Radionuclide: Technetium 75m Tetrofosmin  Resting Radionuclide Dose: 11.0 mCi   Stress Radionuclide:  Technetium 32m Tetrofosmin  Stress Radionuclide Dose: 33.0 mCi           Stress Protocol Rest HR: 69 Stress HR: 151  Rest BP: 142/80 Stress BP: 178/100  Exercise Time (min): 6:00 mins METS: 7.00   Predicted Max HR: 142 bpm % Max HR: 125.35 bpm Rate Pressure Product: 72536   Dose of Adenosine (mg):  n/a Dose of Lexiscan: n/a mg  Dose of Atropine (mg): n/a Dose of Dobutamine: n/a mcg/kg/min (at max HR)  Stress Test Technologist: Frederick Peers, EMT-P  Nuclear Technologist:  Domenic Polite, CNMT     Rest Procedure:  Myocardial perfusion imaging was performed at rest 45 minutes following the intravenous administration of Technetium 47m Tetrofosmin. Rest ECG:  RBBB  Stress Procedure:  The patient performed treadmill exercise using a Bruce  Protocol for 6:00  minutes. The patient stopped due to SOB and denied any chest pain.  There were non specific ST-T wave changes,with multiple PVCs, cuplets during exercise Technetium 53m Tetrofosmin was injected at peak exercise and myocardial perfusion imaging was performed after a brief delay. Stress ECG: No significant change from baseline ECG  QPS Raw Data Images:  Extensive soft tissue (diaphragm, bowel activity, subcutaneous fat) surround heart. Stress Images:  Defect in the inferor wall (base, mid) inferolateral wall (base, mid, distal).  Normal perfusion elsewhere. Rest Images:  Mild improvement in the basal inferolateral walls seen only in short axis images.  Not in other view.  Does not appear significant. Subtraction (SDS):  Borderline. Transient Ischemic Dilatation (Normal <1.22):  1.04 Lung/Heart Ratio (Normal <0.45):  0.30  Quantitative Gated Spect Images QGS EDV:  111 ml QGS ESV:  40 ml  Impression Exercise Capacity:  Fair exercise capacity. BP Response:  Normal blood pressure response. Clinical Symptoms:  No chest pain. ECG Impression:  No significant ST segment change suggestive of ischemia. Comparison with Prior Nuclear Study: Asal inferior ischemia noted.  Overall Impression:  Basal inferior scar; inferior/inferolateral thinning consistent with subendocardial scar or soft tissue attenuation.  Qualitatively, there does not appear to be significant ischemia.  Overall low risk scan.  LV Ejection Fraction: 64%.  LV  Wall Motion:  Basal inferior hypokinesis.  Otherwise normal wall motion, normal LVEF.  Dietrich Pates

## 2012-01-30 ENCOUNTER — Ambulatory Visit (HOSPITAL_COMMUNITY): Payer: Medicare Other | Attending: Internal Medicine

## 2012-01-30 ENCOUNTER — Other Ambulatory Visit: Payer: Self-pay

## 2012-01-30 DIAGNOSIS — E119 Type 2 diabetes mellitus without complications: Secondary | ICD-10-CM | POA: Insufficient documentation

## 2012-01-30 DIAGNOSIS — E785 Hyperlipidemia, unspecified: Secondary | ICD-10-CM | POA: Insufficient documentation

## 2012-01-30 DIAGNOSIS — R011 Cardiac murmur, unspecified: Secondary | ICD-10-CM

## 2012-01-30 DIAGNOSIS — I059 Rheumatic mitral valve disease, unspecified: Secondary | ICD-10-CM | POA: Insufficient documentation

## 2012-01-30 DIAGNOSIS — Z87891 Personal history of nicotine dependence: Secondary | ICD-10-CM | POA: Insufficient documentation

## 2012-01-30 DIAGNOSIS — Z951 Presence of aortocoronary bypass graft: Secondary | ICD-10-CM

## 2012-01-30 DIAGNOSIS — I2510 Atherosclerotic heart disease of native coronary artery without angina pectoris: Secondary | ICD-10-CM

## 2012-01-30 DIAGNOSIS — I10 Essential (primary) hypertension: Secondary | ICD-10-CM

## 2012-01-30 DIAGNOSIS — I517 Cardiomegaly: Secondary | ICD-10-CM | POA: Insufficient documentation

## 2012-02-04 ENCOUNTER — Telehealth: Payer: Self-pay

## 2012-02-04 NOTE — Telephone Encounter (Signed)
Patient called left message on personal voice mail Dr.Jordan wants to see him back in office in 1 year,advised to call sooner if needed.

## 2013-04-22 ENCOUNTER — Encounter: Payer: Self-pay | Admitting: Cardiology

## 2013-11-10 ENCOUNTER — Encounter: Payer: Self-pay | Admitting: Cardiology

## 2013-11-10 ENCOUNTER — Ambulatory Visit (INDEPENDENT_AMBULATORY_CARE_PROVIDER_SITE_OTHER): Payer: Medicare Other | Admitting: Cardiology

## 2013-11-10 VITALS — BP 140/70 | HR 75 | Ht 66.0 in | Wt 170.0 lb

## 2013-11-10 DIAGNOSIS — E785 Hyperlipidemia, unspecified: Secondary | ICD-10-CM

## 2013-11-10 DIAGNOSIS — R011 Cardiac murmur, unspecified: Secondary | ICD-10-CM

## 2013-11-10 DIAGNOSIS — I2510 Atherosclerotic heart disease of native coronary artery without angina pectoris: Secondary | ICD-10-CM

## 2013-11-10 NOTE — Patient Instructions (Signed)
Continue your current therapy  I will see you in one year   

## 2013-11-10 NOTE — Progress Notes (Signed)
Gabriel Lewis Date of Birth: 09/09/1932 Medical Record #[id number]  History of Present Illness: Gabriel Lewis is seen for follow up today. He has a history of CAD. He underwent coronary bypass surgery in 1995 by Dr. Redmond Pulling. This included an LIMA graft to the LAD, saphenous vein graft sequentially to the first and second obtuse marginal vessels, and saphenous vein graft to the right coronary. He was last seen in 2005. At that time he experienced some shoulder and arm discomfort. A stress Myoview study demonstrating inferior basal ischemia with normal ejection fraction.  Subsequent cardiac catheterization demonstrated occlusion of the vein graft to the right coronary which was supplied by good collateral flow. His other grafts were patent. Since that time  he has done well. He denies any significant chest pain or shortness of breath. He's had no palpitations. He had complete evaluation with a Myoview study and Echo in 4/13. He did change his diet significantly and lost 13 lbs. His Actos dose was reduced. He had bruising that resolved with reduction in ASA to 81 mg daily.  Current Outpatient Prescriptions on File Prior to Visit  Medication Sig Dispense Refill  . amLODipine (NORVASC) 5 MG tablet Take 5 mg by mouth daily.        Marland Kitchen arginine 500 MG tablet Take 500 mg by mouth daily.        Marland Kitchen aspirin 325 MG tablet Take 81 mg by mouth daily.       . Cholecalciferol (VITAMIN D3) 1000 UNITS CAPS Take 1 capsule by mouth daily.        . Cinnamon 500 MG capsule Take 500 mg by mouth daily.        . colesevelam (WELCHOL) 625 MG tablet Take 1,875 mg by mouth 2 (two) times daily as needed.       . ezetimibe (ZETIA) 10 MG tablet Take 10 mg by mouth daily.        . fish oil-omega-3 fatty acids 1000 MG capsule Take 2 g by mouth 3 (three) times daily.        . Garlic TABS Take 1 tablet by mouth daily.        . Melatonin 3 MG CAPS Take 1 capsule by mouth daily.        . metFORMIN (GLUCOPHAGE) 500 MG tablet Take 1,000 mg  by mouth 2 (two) times daily with a meal.        . Multiple Vitamins-Minerals (VISION FORMULA) TABS Take 1 tablet by mouth daily.        Marland Kitchen omeprazole (PRILOSEC) 20 MG capsule Take 20 mg by mouth daily.        Marland Kitchen oxybutynin (DITROPAN-XL) 5 MG 24 hr tablet Take 5 mg by mouth. As needed      . pioglitazone (ACTOS) 30 MG tablet Take 15 mg by mouth daily.       . polyethylene glycol powder (MIRALAX) powder Take 17 g by mouth. As needed       . Tamsulosin HCl (FLOMAX) 0.4 MG CAPS Take 0.4 mg by mouth daily.        Marland Kitchen zolpidem (AMBIEN) 5 MG tablet Take 5 mg by mouth at bedtime as needed.         Current Facility-Administered Medications on File Prior to Visit  Medication Dose Route Frequency Provider Last Rate Last Dose  . 0.9 %  sodium chloride infusion  500 mL Intravenous Continuous Ladene Artist, MD        Allergies  Allergen  Reactions  . Statins Other (See Comments)    myalgias    Past Medical History  Diagnosis Date  . Diverticulosis   . Internal hemorrhoids   . Diabetes mellitus   . CAD (coronary artery disease)   . Hypertension   . BPH (benign prostatic hyperplasia)   . ED (erectile dysfunction)   . Hyperlipidemia   . Arthritis   . Iron deficiency anemia   . Esophageal stricture   . Hiatal hernia   . Gastric ulcer   . Hypogonadism male   . Pneumonia   . GERD (gastroesophageal reflux disease)     Past Surgical History  Procedure Laterality Date  . Appendectomy    . Tonsillectomy    . Coronary artery bypass graft  1995    LIMA-LAD, svg-OM1-2, svg-rca  . Inguinal hernia repair      Bilateral  . Basal cell carcinoma excision      History  Smoking status  . Former Smoker  . Quit date: 10/06/1993  Smokeless tobacco  . Never Used    History  Alcohol Use No    Family History  Problem Relation Age of Onset  . Heart attack Father     Died at age 77.  . Colon cancer Neg Hx     Review of Systems: As noted in HPI. All other systems were reviewed and are  negative.  Physical Exam: BP 140/70  Pulse 75  Ht 5\' 6"  (1.676 m)  Wt 170 lb (77.111 kg)  BMI 27.45 kg/m2 He is a well-developed, elderly male in no acute distress. His HEENT exam is unremarkable. Pupils are equal round and reactive to light accommodation. Extraocular movements are full. Oropharynx is clear. Neck is supple without JVD, adenopathy, thyromegaly, or bruits. Lungs are clear. Cardiac exam reveals a regular rate and rhythm. There is a harsh grade 1-6/1 systolic ejection murmur in the right upper sternal border. Abdomen is soft and nontender without masses or hepatosplenomegaly. Femoral and pedal pulses are 2+. He has no cyanosis or edema. Skin is warm and dry. He is alert and oriented x3. Cranial nerves II through XII are intact.  LABORATORY DATA: ECG today demonstrates normal sinus rhythm with PACs and with first degree AV block and a right bundle branch block. No change from 2013 except for PACs.  Echo: 4/13Study Conclusions  - Left ventricle: The cavity size was mildly reduced. Wall thickness was increased in a pattern of moderate LVH. Systolic function was normal. The estimated ejection fraction was in the range of 60% to 65%. - Aortic valve: Mild regurgitation. - Right ventricle: The cavity size was mildly dilated. Wall thickness was normal. - Right atrium: The atrium was mildly dilated.  Cardiology Nuclear Med Study  Gabriel Lewis is a 80 y.o. male MRN : [id number] DOB: 01/30/1932  Procedure Date: 01/26/2012  Nuclear Med Background  Indication for Stress Test: Evaluation for Ischemia and Graft Patency  History: 95' CABG x7,09'ECHO EF:55-60 nml,05' Heart Catheterization EF 55% occ.saph.grafttp RCA good collaterals, 05' Myocardial Perfusion Study -inf basal isch.murmur,CAD  Cardiac Risk Factors: Family History - CAD, History of Smoking, Lipids, NIDDM and TIA  Symptoms: SOB  Nuclear Pre-Procedure  Caffeine/Decaff Intake: None  NPO After: 8:00pm   Lungs: clear  O2 Sat: 95%  on room air.  IV 0.9% NS with Angio Cath: 20g   IV Site: R Hand  IV Started by: Matilde Haymaker, RN   Chest Size (in): 44  Cup Size: n/a   Height: 5\' 9"  (1.753  m)  Weight: 178 lb (80.74 kg)   BMI: Body mass index is 26.29 kg/(m^2).  Tech Comments: No Metformin or Actos   Nuclear Med Study  1 or 2 day study: 1 day  Stress Test Type: Stress   Reading MD: Dorris Carnes, MD  Order Authorizing Provider: Geanie Cooley   Resting Radionuclide: Technetium 27m Tetrofosmin  Resting Radionuclide Dose: 11.0 mCi   Stress Radionuclide: Technetium 37m Tetrofosmin  Stress Radionuclide Dose: 33.0 mCi   Stress Protocol  Rest HR: 69  Stress HR: 151   Rest BP: 142/80  Stress BP: 178/100   Exercise Time (min): 6:00 mins  METS: 7.00   Predicted Max HR: 142 bpm  % Max HR: 125.35 bpm  Rate Pressure Product: 26878  Dose of Adenosine (mg): n/a  Dose of Lexiscan: n/a mg   Dose of Atropine (mg): n/a  Dose of Dobutamine: n/a mcg/kg/min (at max HR)   Stress Test Technologist: Ileene Hutchinson, EMT-P  Nuclear Technologist: Charlton Amor, CNMT   Rest Procedure: Myocardial perfusion imaging was performed at rest 45 minutes following the intravenous administration of Technetium 11m Tetrofosmin.  Rest ECG: RBBB  Stress Procedure: The patient performed treadmill exercise using a Bruce Protocol for 6:00 minutes. The patient stopped due to SOB and denied any chest pain. There were non specific ST-T wave changes,with multiple PVCs, cuplets during exercise Technetium 16m Tetrofosmin was injected at peak exercise and myocardial perfusion imaging was performed after a brief delay.  Stress ECG: No significant change from baseline ECG  QPS  Raw Data Images: Extensive soft tissue (diaphragm, bowel activity, subcutaneous fat) surround heart.  Stress Images: Defect in the inferor wall (base, mid) inferolateral wall (base, mid, distal). Normal perfusion elsewhere.  Rest Images: Mild improvement in the basal inferolateral walls seen  only in short axis images. Not in other view. Does not appear significant.  Subtraction (SDS): Borderline.  Transient Ischemic Dilatation (Normal <1.22): 1.04  Lung/Heart Ratio (Normal <0.45): 0.30  Quantitative Gated Spect Images  QGS EDV: 111 ml  QGS ESV: 40 ml  Impression  Exercise Capacity: Fair exercise capacity.  BP Response: Normal blood pressure response.  Clinical Symptoms: No chest pain.  ECG Impression: No significant ST segment change suggestive of ischemia.  Comparison with Prior Nuclear Study: Asal inferior ischemia noted.  Overall Impression: Basal inferior scar; inferior/inferolateral thinning consistent with subendocardial scar or soft tissue attenuation. Qualitatively, there does not appear to be significant ischemia. Overall low risk scan.  LV Ejection Fraction: 64%. LV Wall Motion: Basal inferior hypokinesis. Otherwise normal wall motion, normal LVEF.  Dorris Carnes    Assessment / Plan: 1. CAD s/p remote CABG in 1995. Known occlusion of SVG to RCA in 2005. Last myoview in 2013 showed inferior wall scar without ischemia. EF normal. Currently asymptomatic. Continue medical Rx.  2. Hyperlipidemia. Intolerant to statins. On Zetia, Welchol, and fish oil.   3. DM improved control with dietary modification and weight loss.   4. Murmur- no significant valvular pathology noted on Echo.  Disposition. I will follow up in one year.

## 2014-05-26 ENCOUNTER — Ambulatory Visit: Payer: Medicare Other | Attending: Internal Medicine | Admitting: *Deleted

## 2014-05-26 DIAGNOSIS — IMO0001 Reserved for inherently not codable concepts without codable children: Secondary | ICD-10-CM | POA: Diagnosis present

## 2014-05-26 DIAGNOSIS — R293 Abnormal posture: Secondary | ICD-10-CM | POA: Diagnosis not present

## 2014-05-26 DIAGNOSIS — R269 Unspecified abnormalities of gait and mobility: Secondary | ICD-10-CM | POA: Diagnosis not present

## 2014-05-29 ENCOUNTER — Ambulatory Visit: Payer: Medicare Other | Admitting: Physical Therapy

## 2014-05-29 DIAGNOSIS — IMO0001 Reserved for inherently not codable concepts without codable children: Secondary | ICD-10-CM | POA: Diagnosis not present

## 2014-06-05 ENCOUNTER — Ambulatory Visit: Payer: Medicare Other | Admitting: Physical Therapy

## 2014-06-05 DIAGNOSIS — IMO0001 Reserved for inherently not codable concepts without codable children: Secondary | ICD-10-CM | POA: Diagnosis not present

## 2014-06-07 ENCOUNTER — Encounter: Payer: Self-pay | Admitting: Gastroenterology

## 2014-06-08 ENCOUNTER — Ambulatory Visit: Payer: Medicare Other | Attending: Internal Medicine | Admitting: Physical Therapy

## 2014-06-08 DIAGNOSIS — R293 Abnormal posture: Secondary | ICD-10-CM | POA: Diagnosis not present

## 2014-06-08 DIAGNOSIS — R269 Unspecified abnormalities of gait and mobility: Secondary | ICD-10-CM | POA: Diagnosis not present

## 2014-06-08 DIAGNOSIS — IMO0001 Reserved for inherently not codable concepts without codable children: Secondary | ICD-10-CM | POA: Diagnosis present

## 2014-06-15 ENCOUNTER — Ambulatory Visit: Payer: Medicare Other | Admitting: Physical Therapy

## 2014-06-15 DIAGNOSIS — IMO0001 Reserved for inherently not codable concepts without codable children: Secondary | ICD-10-CM | POA: Diagnosis not present

## 2014-06-19 ENCOUNTER — Ambulatory Visit: Payer: Medicare Other | Admitting: Physical Therapy

## 2014-06-19 DIAGNOSIS — IMO0001 Reserved for inherently not codable concepts without codable children: Secondary | ICD-10-CM | POA: Diagnosis not present

## 2014-06-22 ENCOUNTER — Ambulatory Visit: Payer: Medicare Other | Admitting: Physical Therapy

## 2014-06-22 DIAGNOSIS — IMO0001 Reserved for inherently not codable concepts without codable children: Secondary | ICD-10-CM | POA: Diagnosis not present

## 2014-06-26 ENCOUNTER — Ambulatory Visit: Payer: Medicare Other | Admitting: Physical Therapy

## 2014-06-26 DIAGNOSIS — IMO0001 Reserved for inherently not codable concepts without codable children: Secondary | ICD-10-CM | POA: Diagnosis not present

## 2014-06-29 ENCOUNTER — Ambulatory Visit: Payer: Medicare Other | Admitting: Physical Therapy

## 2014-06-29 DIAGNOSIS — IMO0001 Reserved for inherently not codable concepts without codable children: Secondary | ICD-10-CM | POA: Diagnosis not present

## 2014-07-03 ENCOUNTER — Ambulatory Visit: Payer: Medicare Other | Admitting: Physical Therapy

## 2014-07-03 DIAGNOSIS — IMO0001 Reserved for inherently not codable concepts without codable children: Secondary | ICD-10-CM | POA: Diagnosis not present

## 2014-07-06 ENCOUNTER — Ambulatory Visit: Payer: Medicare Other | Attending: Internal Medicine | Admitting: Physical Therapy

## 2014-07-06 DIAGNOSIS — Z5189 Encounter for other specified aftercare: Secondary | ICD-10-CM | POA: Insufficient documentation

## 2014-07-06 DIAGNOSIS — R293 Abnormal posture: Secondary | ICD-10-CM | POA: Insufficient documentation

## 2014-07-06 DIAGNOSIS — R269 Unspecified abnormalities of gait and mobility: Secondary | ICD-10-CM | POA: Diagnosis not present

## 2014-07-10 ENCOUNTER — Ambulatory Visit: Payer: Medicare Other | Admitting: Physical Therapy

## 2014-07-10 DIAGNOSIS — Z5189 Encounter for other specified aftercare: Secondary | ICD-10-CM | POA: Diagnosis not present

## 2014-07-12 ENCOUNTER — Telehealth: Payer: Self-pay | Admitting: Hematology and Oncology

## 2014-07-12 ENCOUNTER — Ambulatory Visit: Payer: Medicare Other | Admitting: Physical Therapy

## 2014-07-12 NOTE — Telephone Encounter (Signed)
LEFT MESSAGE FOR PATIENT AND GAVE NP APPT FOR 10/20 @ 9:45 W/DR. Collins. CONTACT INFORMATION WAS GIVEN FOR PATIENT TO RETURN CALL TO CONFIRM MESSAGE/NP APPT.

## 2014-07-17 ENCOUNTER — Ambulatory Visit: Payer: Medicare Other | Admitting: Physical Therapy

## 2014-07-17 DIAGNOSIS — Z5189 Encounter for other specified aftercare: Secondary | ICD-10-CM | POA: Diagnosis not present

## 2014-07-20 ENCOUNTER — Ambulatory Visit: Payer: Medicare Other | Admitting: Physical Therapy

## 2014-07-20 DIAGNOSIS — Z5189 Encounter for other specified aftercare: Secondary | ICD-10-CM | POA: Diagnosis not present

## 2014-07-24 ENCOUNTER — Ambulatory Visit: Payer: Medicare Other | Admitting: Physical Therapy

## 2014-07-24 DIAGNOSIS — Z5189 Encounter for other specified aftercare: Secondary | ICD-10-CM | POA: Diagnosis not present

## 2014-07-25 ENCOUNTER — Ambulatory Visit (HOSPITAL_BASED_OUTPATIENT_CLINIC_OR_DEPARTMENT_OTHER): Payer: Medicare Other

## 2014-07-25 ENCOUNTER — Encounter: Payer: Self-pay | Admitting: Hematology and Oncology

## 2014-07-25 ENCOUNTER — Ambulatory Visit (HOSPITAL_BASED_OUTPATIENT_CLINIC_OR_DEPARTMENT_OTHER): Payer: Medicare Other | Admitting: Hematology and Oncology

## 2014-07-25 VITALS — BP 157/71 | HR 71 | Temp 98.1°F | Resp 18 | Ht 66.0 in | Wt 168.2 lb

## 2014-07-25 DIAGNOSIS — D509 Iron deficiency anemia, unspecified: Secondary | ICD-10-CM

## 2014-07-25 DIAGNOSIS — D472 Monoclonal gammopathy: Secondary | ICD-10-CM

## 2014-07-25 DIAGNOSIS — Z85820 Personal history of malignant melanoma of skin: Secondary | ICD-10-CM

## 2014-07-25 HISTORY — DX: Monoclonal gammopathy: D47.2

## 2014-07-25 HISTORY — DX: Iron deficiency anemia, unspecified: D50.9

## 2014-07-25 NOTE — Assessment & Plan Note (Signed)
He has been taking iron for while. I will recheck iron studies.

## 2014-07-25 NOTE — Progress Notes (Signed)
Checked in new patient with no financial issues prior to seeing dr. He has appt card and [place]

## 2014-07-25 NOTE — Assessment & Plan Note (Signed)
He has repeated skin resection for recurrence of melanoma. Recommend close followup with dermatologist and reinforce avoiding excessive sun exposure.

## 2014-07-25 NOTE — Assessment & Plan Note (Signed)
I suspect he may have mild disease. I will proceed with blood work, 24-hour urine collection and skeletal survey and see him back in 2 weeks to review test results.

## 2014-07-25 NOTE — Progress Notes (Signed)
[place] NOTE  Patient Care Team: Precious Reel, MD as PCP - General (Internal Medicine)  CHIEF COMPLAINTS/PURPOSE OF CONSULTATION:  Abnormal serum protein electrophoresis  HISTORY OF PRESENTING ILLNESS:  Gabriel Lewis 80 y.o. male is here because of abnormal serum protein electrophoresis. This patient was noted to have abnormal total protein level and subsequent additional workup showed IgA lambda MGUS. He denies history of abnormal bone pain or bone fracture. Patient denies history of recurrent infection or atypical infections such as shingles of meningitis. Denies chills, night sweats, anorexia or abnormal weight loss He was anemic in the past and had been taking iron supplement for long time. Patient had history of recurrent melanoma and had recent skin removal near his left temple, complicated by poor wound healing. He has close followup with dermatologist.  MEDICAL HISTORY:  Past Medical History  Diagnosis Date  . Diverticulosis   . Internal hemorrhoids   . Diabetes mellitus   . CAD (coronary artery disease)   . Hypertension   . BPH (benign prostatic hyperplasia)   . ED (erectile dysfunction)   . Hyperlipidemia   . Arthritis   . Iron deficiency anemia   . Esophageal stricture   . Hiatal hernia   . Gastric ulcer   . Hypogonadism male   . Pneumonia   . GERD (gastroesophageal reflux disease)   . MGUS (monoclonal gammopathy of unknown significance) 07/25/2014  . Iron deficiency anemia 07/25/2014    SURGICAL HISTORY: Past Surgical History  Procedure Laterality Date  . Appendectomy    . Tonsillectomy    . Coronary artery bypass graft  1995    LIMA-LAD, svg-OM1-2, svg-rca  . Inguinal hernia repair      Bilateral  . Basal cell carcinoma excision      SOCIAL HISTORY: History   Social History  . Marital Status: Married    Spouse Name: N/A    Number of Children: 3  . Years of Education: N/A   Occupational History  . Retired      Charity fundraiser   Social History Main Topics  . Smoking status: Former Smoker    Quit date: 10/06/1993  . Smokeless tobacco: Never Used  . Alcohol Use: No  . Drug Use: No  . Sexual Activity: Not on file   Other Topics Concern  . Not on file   Social History Narrative   2 caffeine drinks daily     FAMILY HISTORY: Family History  Problem Relation Age of Onset  . Heart attack Father     Died at age 32.  . Colon cancer Neg Hx     ALLERGIES:  is allergic to statins.  MEDICATIONS:  Current Outpatient Prescriptions  Medication Sig Dispense Refill  . amLODipine (NORVASC) 5 MG tablet Take 5 mg by mouth daily.        Marland Kitchen arginine 500 MG tablet Take 500 mg by mouth daily.        Marland Kitchen aspirin 325 MG tablet Take 81 mg by mouth daily.       . Cholecalciferol (VITAMIN D3) 1000 UNITS CAPS Take 1 capsule by mouth daily.        . Cinnamon 500 MG capsule Take 500 mg by mouth daily.        . colesevelam (WELCHOL) 625 MG tablet Take 1,875 mg by mouth 2 (two) times daily as needed.       . ezetimibe (ZETIA) 10 MG tablet Take 10 mg by mouth daily.        Marland Kitchen  fish oil-omega-3 fatty acids 1000 MG capsule Take 2 g by mouth 3 (three) times daily.        . Garlic TABS Take 1 tablet by mouth daily.        . Melatonin 3 MG CAPS Take 1 capsule by mouth daily.        . metFORMIN (GLUCOPHAGE) 500 MG tablet Take 1,000 mg by mouth 2 (two) times daily with a meal.        . Multiple Vitamins-Minerals (VISION FORMULA) TABS Take 1 tablet by mouth daily.        Marland Kitchen omeprazole (PRILOSEC) 20 MG capsule Take 20 mg by mouth daily.        Marland Kitchen oxybutynin (DITROPAN-XL) 5 MG 24 hr tablet Take 5 mg by mouth. As needed      . pioglitazone (ACTOS) 30 MG tablet Take 15 mg by mouth daily.       . polyethylene glycol powder (MIRALAX) powder Take 17 g by mouth. As needed       . Tamsulosin HCl (FLOMAX) 0.4 MG CAPS Take 0.4 mg by mouth daily.        Marland Kitchen zolpidem (AMBIEN) 5 MG tablet Take 5 mg by mouth at bedtime as needed.         Current  Facility-Administered Medications  Medication Dose Route Frequency Provider Last Rate Last Dose  . 0.9 %  sodium chloride infusion  500 mL Intravenous Continuous Ladene Artist, MD        REVIEW OF SYSTEMS:   Eyes: Denies blurriness of vision, double vision or watery eyes Ears, nose, mouth, throat, and face: Denies mucositis or sore throat Respiratory: Denies cough, dyspnea or wheezes Cardiovascular: Denies palpitation, chest discomfort or lower extremity swelling Gastrointestinal:  Denies nausea, heartburn or change in bowel habits Skin: Denies abnormal skin rashes Lymphatics: Denies new lymphadenopathy or easy bruising Neurological:Denies numbness, tingling or new weaknesses Behavioral/Psych: Mood is stable, no new changes  All other systems were reviewed with the patient and are negative.  PHYSICAL EXAMINATION: ECOG PERFORMANCE STATUS: 0 - Asymptomatic  Filed Vitals:   07/25/14 1012  BP: 157/71  Pulse: 71  Temp: 98.1 F (36.7 C)  Resp: 18   Filed Weights   07/25/14 1012  Weight: 168 lb 3.2 oz (76.295 kg)    GENERAL:alert, no distress and comfortable SKIN: skin color, texture, turgor are normal, no rashes or significant lesions noted repeated surgical scars on his face. EYES: normal, conjunctiva are pink and non-injected, sclera clear OROPHARYNX:no exudate, no erythema and lips, buccal mucosa, and tongue normal  NECK: supple, thyroid normal size, non-tender, without nodularity LYMPH:  no palpable lymphadenopathy in the cervical, axillary or inguinal LUNGS: clear to auscultation and percussion with normal breathing effort HEART: regular rate & rhythm with mild systolic murmurs and no lower extremity edema ABDOMEN:abdomen soft, non-tender and normal bowel sounds Musculoskeletal:no cyanosis of digits and no clubbing  PSYCH: alert & oriented x 3 with fluent speech NEURO: no focal motor/sensory deficits  LABORATORY DATA:  I have reviewed the data as listed Lab Results   Component Value Date   WBC 7.6 08/25/2008   HGB 10.3 DELTA CHECK NOTED* 08/25/2008   HCT 31.1* 08/25/2008   MCV 87.5 08/25/2008   PLT 250 08/25/2008    ASSESSMENT & PLAN:  MGUS (monoclonal gammopathy of unknown significance) I suspect he may have mild disease. I will proceed with blood work, 24-hour urine collection and skeletal survey and see him back in 2 weeks to review  test results.  Iron deficiency anemia He has been taking iron for while. I will recheck iron studies.  History of melanoma He has repeated skin resection for recurrence of melanoma. Recommend close followup with dermatologist and reinforce avoiding excessive sun exposure.     Orders Placed This Encounter  Procedures  . DG Bone Survey Met    Standing Status: Future     Number of Occurrences:      Standing Expiration Date: 09/24/2015    Order Specific Question:  Reason for Exam (SYMPTOM  OR DIAGNOSIS REQUIRED)    Answer:  staging myeloma    Order Specific Question:  Preferred imaging location?    Answer:  [facility]  . CBC with Differential    Standing Status: Future     Number of Occurrences:      Standing Expiration Date: 09/24/2015  . Comprehensive metabolic panel    Standing Status: Future     Number of Occurrences:      Standing Expiration Date: 09/24/2015  . Lactate dehydrogenase    Standing Status: Future     Number of Occurrences:      Standing Expiration Date: 09/24/2015  . SPEP & IFE with QIG    Standing Status: Future     Number of Occurrences:      Standing Expiration Date: 09/24/2015  . Kappa/lambda light chains    Standing Status: Future     Number of Occurrences:      Standing Expiration Date: 09/24/2015  . Immunofixation interpretive, urine    Standing Status: Future     Number of Occurrences:      Standing Expiration Date: 09/24/2015  . Protein Electro, 24-Hour Urine    Standing Status: Future     Number of Occurrences:      Standing Expiration Date: 09/24/2015   . IFE, Urine (with Tot Prot)    Standing Status: Future     Number of Occurrences:      Standing Expiration Date: 09/24/2015  . Beta 2 microglobulin, serum    Standing Status: Future     Number of Occurrences:      Standing Expiration Date: 09/24/2015  . Ferritin    Standing Status: Future     Number of Occurrences:      Standing Expiration Date: 09/24/2015  . Iron and TIBC    Standing Status: Future     Number of Occurrences:      Standing Expiration Date: 09/24/2015  . Sedimentation rate    Standing Status: Future     Number of Occurrences:      Standing Expiration Date: 09/24/2015    All questions were answered. The patient knows to call the clinic with any problems, questions or concerns. I spent 40 minutes counseling the patient face to face. The total time spent in the appointment was 55 minutes and more than 50% was on counseling.     [facility], Joaquin, MD 07/25/2014 9:51 PM

## 2014-07-27 ENCOUNTER — Other Ambulatory Visit (HOSPITAL_BASED_OUTPATIENT_CLINIC_OR_DEPARTMENT_OTHER): Payer: Medicare Other

## 2014-07-27 ENCOUNTER — Telehealth: Payer: Self-pay | Admitting: *Deleted

## 2014-07-27 ENCOUNTER — Telehealth: Payer: Self-pay | Admitting: Hematology and Oncology

## 2014-07-27 ENCOUNTER — Other Ambulatory Visit: Payer: Self-pay | Admitting: Hematology and Oncology

## 2014-07-27 ENCOUNTER — Ambulatory Visit (HOSPITAL_COMMUNITY)
Admission: RE | Admit: 2014-07-27 | Discharge: 2014-07-27 | Disposition: A | Discharge status: Home / Self Care | Payer: Medicare Other | Source: Ambulatory Visit | Attending: Hematology and Oncology | Admitting: Hematology and Oncology

## 2014-07-27 DIAGNOSIS — D509 Iron deficiency anemia, unspecified: Secondary | ICD-10-CM

## 2014-07-27 DIAGNOSIS — D472 Monoclonal gammopathy: Secondary | ICD-10-CM

## 2014-07-27 DIAGNOSIS — K219 Gastro-esophageal reflux disease without esophagitis: Secondary | ICD-10-CM | POA: Diagnosis not present

## 2014-07-27 DIAGNOSIS — E119 Type 2 diabetes mellitus without complications: Secondary | ICD-10-CM | POA: Diagnosis not present

## 2014-07-27 DIAGNOSIS — I2510 Atherosclerotic heart disease of native coronary artery without angina pectoris: Secondary | ICD-10-CM | POA: Diagnosis not present

## 2014-07-27 DIAGNOSIS — E785 Hyperlipidemia, unspecified: Secondary | ICD-10-CM | POA: Insufficient documentation

## 2014-07-27 DIAGNOSIS — I10 Essential (primary) hypertension: Secondary | ICD-10-CM | POA: Diagnosis not present

## 2014-07-27 LAB — CBC WITH DIFFERENTIAL/PLATELET
BASO%: 1.7 % (ref 0.0–2.0)
BASOS ABS: 0.1 10*3/uL (ref 0.0–0.1)
EOS ABS: 0.1 10*3/uL (ref 0.0–0.5)
EOS%: 1.2 % (ref 0.0–7.0)
HEMATOCRIT: 31.5 % — AB (ref 38.4–49.9)
HEMOGLOBIN: 10.1 g/dL — AB (ref 13.0–17.1)
LYMPH#: 1.4 10*3/uL (ref 0.9–3.3)
LYMPH%: 23.6 % (ref 14.0–49.0)
MCH: 26.6 pg — ABNORMAL LOW (ref 27.2–33.4)
MCHC: 32.1 g/dL (ref 32.0–36.0)
MCV: 82.9 fL (ref 79.3–98.0)
MONO#: 0.5 10*3/uL (ref 0.1–0.9)
MONO%: 7.9 % (ref 0.0–14.0)
NEUT#: 3.8 10*3/uL (ref 1.5–6.5)
NEUT%: 65.6 % (ref 39.0–75.0)
PLATELETS: 279 10*3/uL (ref 140–400)
RBC: 3.8 10*6/uL — ABNORMAL LOW (ref 4.20–5.82)
RDW: 15.6 % — ABNORMAL HIGH (ref 11.0–14.6)
WBC: 5.8 10*3/uL (ref 4.0–10.3)

## 2014-07-27 LAB — COMPREHENSIVE METABOLIC PANEL (CC13)
ALT: 10 U/L (ref 0–55)
ANION GAP: 8 meq/L (ref 3–11)
AST: 11 U/L (ref 5–34)
Albumin: 3.6 g/dL (ref 3.5–5.0)
Alkaline Phosphatase: 43 U/L (ref 40–150)
BUN: 17.6 mg/dL (ref 7.0–26.0)
CHLORIDE: 104 meq/L (ref 98–109)
CO2: 28 mEq/L (ref 22–29)
CREATININE: 0.8 mg/dL (ref 0.7–1.3)
Calcium: 9.1 mg/dL (ref 8.4–10.4)
GLUCOSE: 113 mg/dL (ref 70–140)
Potassium: 4.3 mEq/L (ref 3.5–5.1)
Sodium: 140 mEq/L (ref 136–145)
Total Bilirubin: 0.79 mg/dL (ref 0.20–1.20)
Total Protein: 6.3 g/dL — ABNORMAL LOW (ref 6.4–8.3)

## 2014-07-27 LAB — IRON AND TIBC CHCC
%SAT: 11 % — AB (ref 20–55)
Iron: 47 ug/dL (ref 42–163)
TIBC: 436 ug/dL — ABNORMAL HIGH (ref 202–409)
UIBC: 390 ug/dL — AB (ref 117–376)

## 2014-07-27 LAB — FERRITIN CHCC: Ferritin: 7 ng/ml — ABNORMAL LOW (ref 22–316)

## 2014-07-27 LAB — LACTATE DEHYDROGENASE (CC13): LDH: 124 U/L — AB (ref 125–245)

## 2014-07-27 NOTE — Telephone Encounter (Signed)
s.w pt and advised on NOV appt....pt ok adn aware °

## 2014-07-27 NOTE — Telephone Encounter (Signed)
Message copied by Patton Salles on Thu Jul 27, 2014  1:02 PM ------      Message from: [facility], [place]: Thu Jul 27, 2014 12:19 PM      Regarding: severe iron def       Rosemary Mossbarger, he would benefit from Iron infusion and we can schedule that on the same day of return visit, just have to move his appt up a bit earlier to get IV iron added. Can you call him and see if he would agree?      ----- Message -----         From: [facility] Interface         Sent: 07/27/2014  10:37 AM           To: Heath Lark, MD                   ------

## 2014-07-27 NOTE — Telephone Encounter (Signed)
Explained to patient/wife about coming in for iron infusion. Pt states he will take IV iron infusion

## 2014-07-29 LAB — KAPPA/LAMBDA LIGHT CHAINS, FREE, WITH RATIO, 24HR. URINE
KAPPA LIGHT CHAIN, FREE U: 19.4 mg/L (ref 1.35–24.19)
KAPPA/LAMBDA, FREE RATIO: 5.51 (ref 2.04–10.37)
LAMBDA LIGHT CHAIN, FREE U: 3.52 mg/L (ref 0.24–6.66)

## 2014-07-31 LAB — SPEP & IFE WITH QIG
Albumin ELP: 59.7 % (ref 55.8–66.1)
Alpha-1-Globulin: 3.4 % (ref 2.9–4.9)
Alpha-2-Globulin: 11.1 % (ref 7.1–11.8)
BETA 2: 10.7 % — AB (ref 3.2–6.5)
Beta Globulin: 8.1 % — ABNORMAL HIGH (ref 4.7–7.2)
Gamma Globulin: 7 % — ABNORMAL LOW (ref 11.1–18.8)
IGA: 608 mg/dL — AB (ref 68–379)
IgG (Immunoglobin G), Serum: 381 mg/dL — ABNORMAL LOW (ref 650–1600)
IgM, Serum: 25 mg/dL — ABNORMAL LOW (ref 41–251)
M-Spike, %: 0.18 g/dL
Total Protein, Serum Electrophoresis: 6.2 g/dL (ref 6.0–8.3)

## 2014-07-31 LAB — SEDIMENTATION RATE: Sed Rate: 5 mm/hr (ref 0–16)

## 2014-07-31 LAB — KAPPA/LAMBDA LIGHT CHAINS
Kappa free light chain: 1.43 mg/dL (ref 0.33–1.94)
Kappa:Lambda Ratio: 0.43 (ref 0.26–1.65)
Lambda Free Lght Chn: 3.33 mg/dL — ABNORMAL HIGH (ref 0.57–2.63)

## 2014-07-31 LAB — BETA 2 MICROGLOBULIN, SERUM: Beta-2 Microglobulin: 2.36 mg/L (ref <=2.51)

## 2014-08-01 LAB — UPEP/TP, 24-HR URINE
Collection Interval: 24 hours
TOTAL PROTEIN, URINE/DAY: 120 mg/d — AB (ref 50–100)
TOTAL PROTEIN, URINE: 6 mg/dL
TOTAL VOLUME, URINE: 2000 mL

## 2014-08-01 LAB — UIFE/LIGHT CHAINS/TP QN, 24-HR UR
Albumin, U: DETECTED
Alpha 1, Urine: DETECTED — AB
Alpha 2, Urine: DETECTED — AB
Beta, Urine: DETECTED — AB
Gamma Globulin, Urine: DETECTED — AB
TIME-UPE24: 24 h
TOTAL PROTEIN, URINE-UPE24: 6 mg/dL (ref 5–25)
Total Protein, Urine-Ur/day: 120 mg/d (ref <150)
Volume, Urine: 2000 mL

## 2014-08-08 ENCOUNTER — Telehealth: Payer: Self-pay | Admitting: Hematology and Oncology

## 2014-08-08 ENCOUNTER — Encounter: Payer: Self-pay | Admitting: Hematology and Oncology

## 2014-08-08 ENCOUNTER — Ambulatory Visit: Payer: Medicare Other

## 2014-08-08 ENCOUNTER — Ambulatory Visit: Payer: Medicare Other | Admitting: Hematology and Oncology

## 2014-08-08 ENCOUNTER — Ambulatory Visit (HOSPITAL_BASED_OUTPATIENT_CLINIC_OR_DEPARTMENT_OTHER): Payer: Medicare Other | Admitting: Hematology and Oncology

## 2014-08-08 VITALS — BP 141/68 | HR 67 | Temp 97.8°F | Resp 18 | Ht 66.0 in | Wt 163.7 lb

## 2014-08-08 DIAGNOSIS — Z85820 Personal history of malignant melanoma of skin: Secondary | ICD-10-CM

## 2014-08-08 DIAGNOSIS — D472 Monoclonal gammopathy: Secondary | ICD-10-CM

## 2014-08-08 DIAGNOSIS — M8580 Other specified disorders of bone density and structure, unspecified site: Secondary | ICD-10-CM | POA: Insufficient documentation

## 2014-08-08 DIAGNOSIS — D509 Iron deficiency anemia, unspecified: Secondary | ICD-10-CM

## 2014-08-08 NOTE — Assessment & Plan Note (Signed)
Clinically, he has early stage disease. I discussed with him and his wife the natural history of MGUS. He has no evidence of organ damage. We'll see him back in 6 months with repeat blood work and physical examination.

## 2014-08-08 NOTE — Assessment & Plan Note (Signed)
He is taking oral vitamin D and calcium supplements along with Fosamax, prescribed by PCP.

## 2014-08-08 NOTE — Assessment & Plan Note (Signed)
He is on antiplatelet agents. This is likely due to GI blood loss. He is not compliant taking his oral iron supplements. We discussed the risks, benefit, side effects of intravenous iron infusion versus oral supplement. The patient would like to try oral supplement again. His concerns are related to GI upset such as constipation with iron supplement. I recommended he can take laxatives as needed if that develop. I recommend iron supplement twice a day for the next 6 months. I will recheck iron studies in the future. If he has difficulties tolerating oral iron supplement, I will be happy to give him iron infusion.

## 2014-08-08 NOTE — Telephone Encounter (Signed)
gv and printed appt sched and avs for pt for April and May 2016

## 2014-08-08 NOTE — Assessment & Plan Note (Addendum)
I recommend close follow-up with dermatologist. 

## 2014-08-08 NOTE — Progress Notes (Signed)
[place] OFFICE PROGRESS NOTE  Patient Care Team: Precious Reel, MD as PCP - General (Internal Medicine)  SUMMARY OF ONCOLOGIC HISTORY: Gabriel Lewis 80 y.o. male is here because of abnormal serum protein electrophoresis. This patient was noted to have abnormal total protein level and subsequent additional workup showed IgA lambda MGUS. He denies history of abnormal bone pain or bone fracture. Patient denies history of recurrent infection or atypical infections such as shingles of meningitis. Denies chills, night sweats, anorexia or abnormal weight loss He was anemic in the past and had been taking iron supplement for long time. Patient had history of recurrent melanoma and had recent skin removal near his left temple, complicated by poor wound healing. He has close followup with dermatologist.  INTERVAL HISTORY: Please see below for problem oriented charting. He feels well. Denies any new bone pain. He admitted not being compliant taking oral iron supplement  REVIEW OF SYSTEMS:   Constitutional: Denies fevers, chills or abnormal weight loss Eyes: Denies blurriness of vision Ears, nose, mouth, throat, and face: Denies mucositis or sore throat Respiratory: Denies cough, dyspnea or wheezes Cardiovascular: Denies palpitation, chest discomfort or lower extremity swelling Gastrointestinal:  Denies nausea, heartburn or change in bowel habits Skin: Denies abnormal skin rashes Lymphatics: Denies new lymphadenopathy or easy bruising Neurological:Denies numbness, tingling or new weaknesses Behavioral/Psych: Mood is stable, no new changes  All other systems were reviewed with the patient and are negative.  I have reviewed the past medical history, past surgical history, social history and family history with the patient and they are unchanged from previous note.  ALLERGIES:  is allergic to statins.  MEDICATIONS:  Current Outpatient Prescriptions  Medication Sig Dispense  Refill  . amLODipine (NORVASC) 5 MG tablet Take 5 mg by mouth daily.      Marland Kitchen arginine 500 MG tablet Take 500 mg by mouth daily.      Marland Kitchen aspirin 325 MG tablet Take 81 mg by mouth daily.     . Cholecalciferol (VITAMIN D3) 1000 UNITS CAPS Take 1 capsule by mouth daily.      . Cinnamon 500 MG capsule Take 500 mg by mouth daily.      . colesevelam (WELCHOL) 625 MG tablet Take 1,875 mg by mouth 2 (two) times daily as needed.     . ezetimibe (ZETIA) 10 MG tablet Take 10 mg by mouth daily.      . fish oil-omega-3 fatty acids 1000 MG capsule Take 2 g by mouth 3 (three) times daily.      . Garlic TABS Take 1 tablet by mouth daily.      . Melatonin 3 MG CAPS Take 1 capsule by mouth daily.      . metFORMIN (GLUCOPHAGE) 500 MG tablet Take 1,000 mg by mouth 2 (two) times daily with a meal.      . Multiple Vitamins-Minerals (VISION FORMULA) TABS Take 1 tablet by mouth daily.      Marland Kitchen omeprazole (PRILOSEC) 20 MG capsule Take 20 mg by mouth daily.      Marland Kitchen oxybutynin (DITROPAN-XL) 5 MG 24 hr tablet Take 5 mg by mouth. As needed    . pioglitazone (ACTOS) 30 MG tablet Take 15 mg by mouth daily.     . polyethylene glycol powder (MIRALAX) powder Take 17 g by mouth. As needed     . Tamsulosin HCl (FLOMAX) 0.4 MG CAPS Take 0.4 mg by mouth daily.      Marland Kitchen zolpidem (AMBIEN) 5 MG  tablet Take 5 mg by mouth at bedtime as needed.       No current facility-administered medications for this visit.    PHYSICAL EXAMINATION: ECOG PERFORMANCE STATUS: 0 - Asymptomatic  Filed Vitals:   08/08/14 0840  BP: 141/68  Pulse: 67  Temp: 97.8 F (36.6 C)  Resp: 18   Filed Weights   08/08/14 0840  Weight: 163 lb 11.2 oz (74.254 kg)    GENERAL:alert, no distress and comfortable Musculoskeletal:no cyanosis of digits and no clubbing  NEURO: alert & oriented x 3 with fluent speech, no focal motor/sensory deficits  LABORATORY DATA:  I have reviewed the data as listed    Component Value Date/Time   NA 140 07/27/2014 1009   NA  136 08/24/2008 0001   K 4.3 07/27/2014 1009   K 3.9 08/24/2008 0001   CL 98 08/24/2008 0001   CO2 28 07/27/2014 1009   CO2 27 08/23/2008 2350   GLUCOSE 113 07/27/2014 1009   GLUCOSE 175* 08/24/2008 0001   BUN 17.6 07/27/2014 1009   BUN 18 08/24/2008 0001   CREATININE 0.8 07/27/2014 1009   CREATININE 1.2 08/24/2008 0001   CALCIUM 9.1 07/27/2014 1009   CALCIUM 9.2 08/23/2008 2350   PROT 6.3* 07/27/2014 1009   PROT 6.5 08/23/2008 2350   ALBUMIN 3.6 07/27/2014 1009   ALBUMIN 3.5 08/23/2008 2350   AST 11 07/27/2014 1009   AST 18 08/23/2008 2350   ALT 10 07/27/2014 1009   ALT 13 08/23/2008 2350   ALKPHOS 43 07/27/2014 1009   ALKPHOS 54 08/23/2008 2350   BILITOT 0.79 07/27/2014 1009   BILITOT 1.1 08/23/2008 2350   GFRNONAA >60 08/23/2008 2350   GFRAA  08/23/2008 2350    >60        The eGFR has been calculated using the MDRD equation. This calculation has not been validated in all clinical    No results found for: SPEP, UPEP  Lab Results  Component Value Date   WBC 5.8 07/27/2014   NEUTROABS 3.8 07/27/2014   HGB 10.1* 07/27/2014   HCT 31.5* 07/27/2014   MCV 82.9 07/27/2014   PLT 279 07/27/2014      Chemistry      Component Value Date/Time   NA 140 07/27/2014 1009   NA 136 08/24/2008 0001   K 4.3 07/27/2014 1009   K 3.9 08/24/2008 0001   CL 98 08/24/2008 0001   CO2 28 07/27/2014 1009   CO2 27 08/23/2008 2350   BUN 17.6 07/27/2014 1009   BUN 18 08/24/2008 0001   CREATININE 0.8 07/27/2014 1009   CREATININE 1.2 08/24/2008 0001      Component Value Date/Time   CALCIUM 9.1 07/27/2014 1009   CALCIUM 9.2 08/23/2008 2350   ALKPHOS 43 07/27/2014 1009   ALKPHOS 54 08/23/2008 2350   AST 11 07/27/2014 1009   AST 18 08/23/2008 2350   ALT 10 07/27/2014 1009   ALT 13 08/23/2008 2350   BILITOT 0.79 07/27/2014 1009   BILITOT 1.1 08/23/2008 2350       RADIOGRAPHIC STUDIES:I reviewed the skeletal survey. I am not concerned about the mild bony abnormalities reported by  the radiologist. I do not believe they are lytic lesions. He is noted to have osteopenia I have personally reviewed the radiological images as listed and agreed with the findings in the report.  ASSESSMENT & PLAN:  MGUS (monoclonal gammopathy of unknown significance) Clinically, he has early stage disease. I discussed with him and his wife the natural history  of MGUS. He has no evidence of organ damage. We'll see him back in 6 months with repeat blood work and physical examination.  History of melanoma I recommend close follow-up with dermatologist.  Iron deficiency anemia He is on antiplatelet agents. This is likely due to GI blood loss. He is not compliant taking his oral iron supplements. We discussed the risks, benefit, side effects of intravenous iron infusion versus oral supplement. The patient would like to try oral supplement again. His concerns are related to GI upset such as constipation with iron supplement. I recommended he can take laxatives as needed if that develop. I recommend iron supplement twice a day for the next 6 months. I will recheck iron studies in the future. If he has difficulties tolerating oral iron supplement, I will be happy to give him iron infusion.  Osteopenia He is taking oral vitamin D and calcium supplements along with Fosamax, prescribed by PCP.   Orders Placed This Encounter  Procedures  . Comprehensive metabolic panel    Standing Status: Future     Number of Occurrences:      Standing Expiration Date: 09/12/2015  . CBC with Differential    Standing Status: Future     Number of Occurrences:      Standing Expiration Date: 09/12/2015  . Lactate dehydrogenase    Standing Status: Future     Number of Occurrences:      Standing Expiration Date: 09/12/2015  . SPEP & IFE with QIG    Standing Status: Future     Number of Occurrences:      Standing Expiration Date: 09/12/2015  . Kappa/lambda light chains    Standing Status: Future     Number of  Occurrences:      Standing Expiration Date: 09/12/2015  . Beta 2 microglobulin, serum    Standing Status: Future     Number of Occurrences:      Standing Expiration Date: 09/12/2015  . Iron and TIBC    Standing Status: Future     Number of Occurrences:      Standing Expiration Date: 09/12/2015  . Ferritin    Standing Status: Future     Number of Occurrences:      Standing Expiration Date: 09/12/2015   All questions were answered. The patient knows to call the clinic with any problems, questions or concerns. No barriers to learning was detected. I spent 25 minutes counseling the patient face to face. The total time spent in the appointment was 30 minutes and more than 50% was on counseling and review of test results     [facility], [place], MD 08/08/2014 9:47 AM

## 2014-08-15 ENCOUNTER — Other Ambulatory Visit (HOSPITAL_COMMUNITY): Payer: Self-pay | Admitting: *Deleted

## 2014-08-16 ENCOUNTER — Encounter (HOSPITAL_COMMUNITY)
Admission: RE | Admit: 2014-08-16 | Discharge: 2014-08-16 | Disposition: A | Discharge status: Home / Self Care | Payer: Medicare Other | Source: Ambulatory Visit | Attending: Internal Medicine | Admitting: Internal Medicine

## 2014-08-16 ENCOUNTER — Other Ambulatory Visit: Payer: Self-pay | Admitting: Hematology and Oncology

## 2014-08-16 DIAGNOSIS — D649 Anemia, unspecified: Secondary | ICD-10-CM | POA: Insufficient documentation

## 2014-08-16 MED ORDER — SODIUM CHLORIDE 0.9 % IV SOLN
1020.0000 mg | Freq: Once | INTRAVENOUS | Status: AC
  Administered 2014-08-16: 1020 mg via INTRAVENOUS
  Filled 2014-08-16: qty 34

## 2014-08-16 NOTE — Discharge Instructions (Signed)

## 2014-11-13 ENCOUNTER — Telehealth: Payer: Self-pay | Admitting: Cardiology

## 2014-11-13 NOTE — Telephone Encounter (Signed)
Received records from [facility] (Dr Shon Baton) for appointment with Dr Martinique on 11/20/14.  Records given to [facility] (medical records) for Dr Doug Sou schedule on 11/20/14. lp

## 2014-11-20 ENCOUNTER — Encounter: Payer: Self-pay | Admitting: Cardiology

## 2014-11-20 ENCOUNTER — Ambulatory Visit (INDEPENDENT_AMBULATORY_CARE_PROVIDER_SITE_OTHER): Payer: Medicare Other | Admitting: Cardiology

## 2014-11-20 VITALS — BP 152/78 | HR 77 | Ht 69.0 in | Wt 166.4 lb

## 2014-11-20 DIAGNOSIS — R011 Cardiac murmur, unspecified: Secondary | ICD-10-CM

## 2014-11-20 DIAGNOSIS — I25709 Atherosclerosis of coronary artery bypass graft(s), unspecified, with unspecified angina pectoris: Secondary | ICD-10-CM

## 2014-11-20 DIAGNOSIS — E785 Hyperlipidemia, unspecified: Secondary | ICD-10-CM

## 2014-11-20 NOTE — Patient Instructions (Signed)
Continue your current therapy  I will see you in one year  Try and walk more

## 2014-11-20 NOTE — Progress Notes (Signed)
Gabriel Lewis Date of Birth: Jun 22, 1932 Medical Record #[id number]  History of Present Illness: Gabriel Lewis is seen for follow CAD. He has a history of CAD. He underwent coronary bypass surgery in 1995 by Dr. Redmond Pulling. This included an LIMA graft to the LAD, saphenous vein graft sequentially to the first and second obtuse marginal vessels, and saphenous vein graft to the right coronary. He was last seen in 2005. At that time he experienced some shoulder and arm discomfort. A stress Myoview study demonstrating inferior basal ischemia with normal ejection fraction.  Subsequent cardiac catheterization demonstrated occlusion of the vein graft to the right coronary which was supplied by good collateral flow. His other grafts were patent. He had complete evaluation with a Myoview study and Echo in 4/13. Myoview showed basal inferior scar, no ischemia and EF 64%. Echo was unremarkable.  On follow up he reports he is doing well. He is not as active and does not walk much anymore. He states he felt better when he was walking. BP has been well controlled. He reports home health nurse checked his pulse and it was 44 bpm. He denies any palpitations or dizziness.   Current Outpatient Prescriptions on File Prior to Visit  Medication Sig Dispense Refill  . amLODipine (NORVASC) 5 MG tablet Take 5 mg by mouth daily.      . Cholecalciferol (VITAMIN D3) 1000 UNITS CAPS Take 1 capsule by mouth daily.      . Cinnamon 500 MG capsule Take 500 mg by mouth daily.      . colesevelam (WELCHOL) 625 MG tablet Take 1,875 mg by mouth 2 (two) times daily as needed.     . fish oil-omega-3 fatty acids 1000 MG capsule Take 2 g by mouth 3 (three) times daily.      . Garlic TABS Take 1 tablet by mouth daily.      . Melatonin 3 MG CAPS Take 1 capsule by mouth daily.      . metFORMIN (GLUCOPHAGE) 500 MG tablet Take 1,000 mg by mouth 2 (two) times daily with a meal.      . Multiple Vitamins-Minerals (VISION FORMULA) TABS Take 1 tablet by  mouth daily.      Marland Kitchen omeprazole (PRILOSEC) 20 MG capsule Take 20 mg by mouth daily.      Marland Kitchen oxybutynin (DITROPAN-XL) 5 MG 24 hr tablet Take 5 mg by mouth. As needed    . polyethylene glycol powder (MIRALAX) powder Take 17 g by mouth. As needed     . Tamsulosin HCl (FLOMAX) 0.4 MG CAPS Take 0.4 mg by mouth daily.      Marland Kitchen zolpidem (AMBIEN) 5 MG tablet Take 5 mg by mouth at bedtime as needed.      Marland Kitchen arginine 500 MG tablet Take 500 mg by mouth daily.       No current facility-administered medications on file prior to visit.    Allergies  Allergen Reactions  . Statins Other (See Comments)    myalgias    Past Medical History  Diagnosis Date  . Diverticulosis   . Internal hemorrhoids   . Diabetes mellitus   . CAD (coronary artery disease)   . Hypertension   . BPH (benign prostatic hyperplasia)   . ED (erectile dysfunction)   . Hyperlipidemia   . Arthritis   . Iron deficiency anemia   . Esophageal stricture   . Hiatal hernia   . Gastric ulcer   . Hypogonadism male   . Pneumonia   .  GERD (gastroesophageal reflux disease)   . MGUS (monoclonal gammopathy of unknown significance) 07/25/2014  . Iron deficiency anemia 07/25/2014    Past Surgical History  Procedure Laterality Date  . Appendectomy    . Tonsillectomy    . Coronary artery bypass graft  1995    LIMA-LAD, svg-[id number], svg-rca  . Inguinal hernia repair      Bilateral  . Basal cell carcinoma excision      History  Smoking status  . Former Smoker  . Quit date: 10/06/1993  Smokeless tobacco  . Never Used    History  Alcohol Use No    Family History  Problem Relation Age of Onset  . Heart attack Father     Died at age 49.  . Colon cancer Neg Hx     Review of Systems: As noted in HPI. All other systems were reviewed and are negative.  Physical Exam: BP 152/78 mmHg  Pulse 77  Ht 5\' 9"  (1.753 m)  Wt 166 lb 6.4 oz (75.479 kg)  BMI 24.56 kg/m2 He is a well-developed, elderly male in no acute distress. His  HEENT exam is unremarkable.  Neck is supple without JVD, adenopathy, thyromegaly, or bruits. Lungs are clear. Cardiac exam reveals a regular rate and rhythm. There is a harsh grade 9-4/7 systolic ejection murmur in the right upper sternal border. Abdomen is soft and nontender without masses or hepatosplenomegaly. Femoral and pedal pulses are 2+. He has no cyanosis or edema. Skin is warm and dry. He is alert and oriented x3. Cranial nerves II through XII are intact.  LABORATORY DATA: ECG today demonstrates normal sinus rhythm with PVCs in a pattern of trigeminy.  Right bundle branch block. Nonspecific TWA. I have personally reviewed and interpreted this study.    Assessment / Plan: 1. CAD s/p remote CABG in 1995. Known occlusion of SVG to RCA in 2005. Last myoview in 2013 showed inferior wall scar without ischemia. EF normal. Currently asymptomatic. Continue medical Rx.  2. Hyperlipidemia. Intolerant to statins. On Zetia, Welchol, and fish oil. Labs monitored by Dr. Virgina Jock.  3. DM improved control with dietary modification and weight loss.   4. Murmur- no significant valvular pathology noted on Echo. ? Due to AV sclerosis.   5. PVCs. Asymptomatic. This may explain his "low pulse". No further therapy needed.   Disposition. I will follow up in one year. I have encouraged him to get back into a walking program.

## 2015-01-30 ENCOUNTER — Other Ambulatory Visit (HOSPITAL_BASED_OUTPATIENT_CLINIC_OR_DEPARTMENT_OTHER): Payer: Medicare Other

## 2015-01-30 DIAGNOSIS — D472 Monoclonal gammopathy: Secondary | ICD-10-CM | POA: Diagnosis not present

## 2015-01-30 DIAGNOSIS — D509 Iron deficiency anemia, unspecified: Secondary | ICD-10-CM

## 2015-01-30 LAB — CBC WITH DIFFERENTIAL/PLATELET
BASO%: 1 % (ref 0.0–2.0)
BASOS ABS: 0.1 10*3/uL (ref 0.0–0.1)
EOS ABS: 0.1 10*3/uL (ref 0.0–0.5)
EOS%: 1 % (ref 0.0–7.0)
HCT: 37.5 % — ABNORMAL LOW (ref 38.4–49.9)
HGB: 12.1 g/dL — ABNORMAL LOW (ref 13.0–17.1)
LYMPH#: 1.4 10*3/uL (ref 0.9–3.3)
LYMPH%: 21.6 % (ref 14.0–49.0)
MCH: 29.4 pg (ref 27.2–33.4)
MCHC: 32.3 g/dL (ref 32.0–36.0)
MCV: 91.2 fL (ref 79.3–98.0)
MONO#: 0.5 10*3/uL (ref 0.1–0.9)
MONO%: 7.8 % (ref 0.0–14.0)
NEUT#: 4.3 10*3/uL (ref 1.5–6.5)
NEUT%: 68.6 % (ref 39.0–75.0)
Platelets: 264 10*3/uL (ref 140–400)
RBC: 4.11 10*6/uL — ABNORMAL LOW (ref 4.20–5.82)
RDW: 14.2 % (ref 11.0–14.6)
WBC: 6.3 10*3/uL (ref 4.0–10.3)

## 2015-01-30 LAB — IRON AND TIBC CHCC
%SAT: 23 % (ref 20–55)
Iron: 78 ug/dL (ref 42–163)
TIBC: 341 ug/dL (ref 202–409)
UIBC: 263 ug/dL (ref 117–376)

## 2015-01-30 LAB — LACTATE DEHYDROGENASE (CC13): LDH: 119 U/L — AB (ref 125–245)

## 2015-01-30 LAB — COMPREHENSIVE METABOLIC PANEL (CC13)
ALBUMIN: 3.9 g/dL (ref 3.5–5.0)
ALK PHOS: 51 U/L (ref 40–150)
ALT: 8 U/L (ref 0–55)
AST: 10 U/L (ref 5–34)
Anion Gap: 12 mEq/L — ABNORMAL HIGH (ref 3–11)
BILIRUBIN TOTAL: 0.73 mg/dL (ref 0.20–1.20)
BUN: 20.1 mg/dL (ref 7.0–26.0)
CALCIUM: 9.5 mg/dL (ref 8.4–10.4)
CO2: 29 meq/L (ref 22–29)
CREATININE: 1 mg/dL (ref 0.7–1.3)
Chloride: 101 mEq/L (ref 98–109)
EGFR: 72 mL/min/{1.73_m2} — ABNORMAL LOW (ref >90)
Glucose: 142 mg/dl — ABNORMAL HIGH (ref 70–140)
Potassium: 4.5 mEq/L (ref 3.5–5.1)
Sodium: 142 mEq/L (ref 136–145)
TOTAL PROTEIN: 6.7 g/dL (ref 6.4–8.3)

## 2015-01-30 LAB — FERRITIN CHCC: Ferritin: 54 ng/ml (ref 22–316)

## 2015-02-01 LAB — SPEP & IFE WITH QIG
ALBUMIN ELP: 3.8 g/dL (ref 3.8–4.8)
Abnormal Protein Band1: 0.5 g/dL
Alpha-1-Globulin: 0.2 g/dL (ref 0.2–0.3)
Alpha-2-Globulin: 0.8 g/dL (ref 0.5–0.9)
Beta 2: 0.8 g/dL — ABNORMAL HIGH (ref 0.2–0.5)
Beta Globulin: 0.4 g/dL (ref 0.4–0.6)
GAMMA GLOBULIN: 0.5 g/dL — AB (ref 0.8–1.7)
IGM, SERUM: 36 mg/dL — AB (ref 41–251)
IgA: 767 mg/dL — ABNORMAL HIGH (ref 68–379)
IgG (Immunoglobin G), Serum: 447 mg/dL — ABNORMAL LOW (ref 650–1600)
TOTAL PROTEIN, SERUM ELECTROPHOR: 6.4 g/dL (ref 6.1–8.1)

## 2015-02-01 LAB — KAPPA/LAMBDA LIGHT CHAINS
Kappa free light chain: 2.05 mg/dL — ABNORMAL HIGH (ref 0.33–1.94)
Kappa:Lambda Ratio: 0.5 (ref 0.26–1.65)
Lambda Free Lght Chn: 4.1 mg/dL — ABNORMAL HIGH (ref 0.57–2.63)

## 2015-02-06 ENCOUNTER — Encounter: Payer: Self-pay | Admitting: Hematology and Oncology

## 2015-02-06 ENCOUNTER — Telehealth: Payer: Self-pay | Admitting: Hematology and Oncology

## 2015-02-06 ENCOUNTER — Ambulatory Visit (HOSPITAL_BASED_OUTPATIENT_CLINIC_OR_DEPARTMENT_OTHER): Payer: Medicare Other | Admitting: Hematology and Oncology

## 2015-02-06 VITALS — BP 139/67 | HR 74 | Temp 98.1°F | Resp 16 | Ht 69.0 in | Wt 167.0 lb

## 2015-02-06 DIAGNOSIS — D472 Monoclonal gammopathy: Secondary | ICD-10-CM | POA: Diagnosis not present

## 2015-02-06 DIAGNOSIS — Z85820 Personal history of malignant melanoma of skin: Secondary | ICD-10-CM | POA: Diagnosis not present

## 2015-02-06 DIAGNOSIS — D509 Iron deficiency anemia, unspecified: Secondary | ICD-10-CM

## 2015-02-06 NOTE — Telephone Encounter (Signed)
Gave patient avs report and appointments for April and May 2017. Central will contact patient re bone survey appointment to be scheduled with labs 01/29/16- patient awar. Added comment to bone survey order that test is to be performed 01/29/16 with labs.

## 2015-02-07 NOTE — Assessment & Plan Note (Signed)
He is on antiplatelet agents. He tolerated oral iron supplement well. His anemia is almost completely resolved. Continue close observation.

## 2015-02-07 NOTE — Assessment & Plan Note (Signed)
I recommend close follow-up with dermatologist.

## 2015-02-07 NOTE — Assessment & Plan Note (Signed)
Clinically, he has early stage disease. I discussed with him and his wife the natural history of MGUS. He has no evidence of organ damage. We'll see him back in 12 months with repeat blood work and physical examination.

## 2015-02-07 NOTE — Progress Notes (Signed)
[place] OFFICE PROGRESS NOTE  Patient Care Team: Shon Baton, MD as PCP - General (Internal Medicine)  SUMMARY OF ONCOLOGIC HISTORY: The patient was referred here because of abnormal serum protein electrophoresis. This patient was noted to have abnormal total protein level and subsequent additional workup showed IgA lambda MGUS. He was anemic in the past and had been taking iron supplement for long time. Patient had history of recurrent melanoma and had recent skin removal near his left temple, complicated by poor wound healing. He has close followup with dermatologist.  INTERVAL HISTORY: Please see below for problem oriented charting. He denies new skin lesions. The patient denies any recent signs or symptoms of bleeding such as spontaneous epistaxis, hematuria or hematochezia. Denies new bone fracture. No recent infection.  REVIEW OF SYSTEMS:   Constitutional: Denies fevers, chills or abnormal weight loss Eyes: Denies blurriness of vision Ears, nose, mouth, throat, and face: Denies mucositis or sore throat Respiratory: Denies cough, dyspnea or wheezes Cardiovascular: Denies palpitation, chest discomfort or lower extremity swelling Gastrointestinal:  Denies nausea, heartburn or change in bowel habits Skin: Denies abnormal skin rashes Lymphatics: Denies new lymphadenopathy or easy bruising Neurological:Denies numbness, tingling or new weaknesses Behavioral/Psych: Mood is stable, no new changes  All other systems were reviewed with the patient and are negative.  I have reviewed the past medical history, past surgical history, social history and family history with the patient and they are unchanged from previous note.  ALLERGIES:  is allergic to statins.  MEDICATIONS:  Current Outpatient Prescriptions  Medication Sig Dispense Refill  . amLODipine (NORVASC) 5 MG tablet Take 5 mg by mouth daily.      Marland Kitchen arginine 500 MG tablet Take 500 mg by mouth daily.      Marland Kitchen  aspirin EC 81 MG tablet Take 81 mg by mouth daily.    . Cholecalciferol (VITAMIN D3) 1000 UNITS CAPS Take 1 capsule by mouth daily.      . colesevelam (WELCHOL) 625 MG tablet Take 1,875 mg by mouth 2 (two) times daily as needed.     . fish oil-omega-3 fatty acids 1000 MG capsule Take 2 g by mouth 3 (three) times daily.      . Garlic TABS Take 1 tablet by mouth daily.      . Melatonin 3 MG CAPS Take 1 capsule by mouth as needed.     . metFORMIN (GLUCOPHAGE) 500 MG tablet Take 1,000 mg by mouth 2 (two) times daily with a meal.      . Multiple Vitamins-Minerals (VISION FORMULA) TABS Take 1 tablet by mouth daily.      Marland Kitchen omeprazole (PRILOSEC) 20 MG capsule Take 20 mg by mouth daily.      Marland Kitchen oxybutynin (DITROPAN-XL) 5 MG 24 hr tablet Take 5 mg by mouth. As needed    . polyethylene glycol powder (MIRALAX) powder Take 17 g by mouth. As needed     . Tamsulosin HCl (FLOMAX) 0.4 MG CAPS Take 0.4 mg by mouth daily.      Marland Kitchen zolpidem (AMBIEN) 5 MG tablet Take 5 mg by mouth at bedtime as needed.       No current facility-administered medications for this visit.    PHYSICAL EXAMINATION: ECOG PERFORMANCE STATUS: 0 - Asymptomatic  Filed Vitals:   02/06/15 0930  BP: 139/67  Pulse: 74  Temp: 98.1 F (36.7 C)  Resp: 16   Filed Weights   02/06/15 0930  Weight: 167 lb (75.751 kg)    GENERAL:alert,  no distress and comfortable SKIN: skin color, texture, turgor are normal, no rashes or significant lesions EYES: normal, Conjunctiva are pink and non-injected, sclera clear Musculoskeletal:no cyanosis of digits and no clubbing  NEURO: alert & oriented x 3 with fluent speech, no focal motor/sensory deficits  LABORATORY DATA:  I have reviewed the data as listed    Component Value Date/Time   NA 142 01/30/2015 1038   NA 136 08/24/2008 0001   K 4.5 01/30/2015 1038   K 3.9 08/24/2008 0001   CL 98 08/24/2008 0001   CO2 29 01/30/2015 1038   CO2 27 08/23/2008 2350   GLUCOSE 142* 01/30/2015 1038   GLUCOSE  175* 08/24/2008 0001   BUN 20.1 01/30/2015 1038   BUN 18 08/24/2008 0001   CREATININE 1.0 01/30/2015 1038   CREATININE 1.2 08/24/2008 0001   CALCIUM 9.5 01/30/2015 1038   CALCIUM 9.2 08/23/2008 2350   PROT 6.7 01/30/2015 1038   PROT 6.5 08/23/2008 2350   ALBUMIN 3.9 01/30/2015 1038   ALBUMIN 3.5 08/23/2008 2350   AST 10 01/30/2015 1038   AST 18 08/23/2008 2350   ALT 8 01/30/2015 1038   ALT 13 08/23/2008 2350   ALKPHOS 51 01/30/2015 1038   ALKPHOS 54 08/23/2008 2350   BILITOT 0.73 01/30/2015 1038   BILITOT 1.1 08/23/2008 2350   GFRNONAA >60 08/23/2008 2350   GFRAA  08/23/2008 2350    >60        The eGFR has been calculated using the MDRD equation. This calculation has not been validated in all clinical    No results found for: SPEP, UPEP  Lab Results  Component Value Date   WBC 6.3 01/30/2015   NEUTROABS 4.3 01/30/2015   HGB 12.1* 01/30/2015   HCT 37.5* 01/30/2015   MCV 91.2 01/30/2015   PLT 264 01/30/2015      Chemistry      Component Value Date/Time   NA 142 01/30/2015 1038   NA 136 08/24/2008 0001   K 4.5 01/30/2015 1038   K 3.9 08/24/2008 0001   CL 98 08/24/2008 0001   CO2 29 01/30/2015 1038   CO2 27 08/23/2008 2350   BUN 20.1 01/30/2015 1038   BUN 18 08/24/2008 0001   CREATININE 1.0 01/30/2015 1038   CREATININE 1.2 08/24/2008 0001      Component Value Date/Time   CALCIUM 9.5 01/30/2015 1038   CALCIUM 9.2 08/23/2008 2350   ALKPHOS 51 01/30/2015 1038   ALKPHOS 54 08/23/2008 2350   AST 10 01/30/2015 1038   AST 18 08/23/2008 2350   ALT 8 01/30/2015 1038   ALT 13 08/23/2008 2350   BILITOT 0.73 01/30/2015 1038   BILITOT 1.1 08/23/2008 2350     ASSESSMENT & PLAN:  MGUS (monoclonal gammopathy of unknown significance) Clinically, he has early stage disease. I discussed with him and his wife the natural history of MGUS. He has no evidence of organ damage. We'll see him back in 12 months with repeat blood work and physical  examination.     History of melanoma I recommend close follow-up with dermatologist.   Iron deficiency anemia He is on antiplatelet agents. He tolerated oral iron supplement well. His anemia is almost completely resolved. Continue close observation.    Orders Placed This Encounter  Procedures  . DG Bone Survey Met    Standing Status: Future     Number of Occurrences:      Standing Expiration Date: 04/07/2016    Order Specific Question:  Reason for Exam (  SYMPTOM  OR DIAGNOSIS REQUIRED)    Answer:  staging myeloma    Order Specific Question:  Preferred imaging location?    Answer:  [facility]  . CBC with Differential/Platelet    Standing Status: Future     Number of Occurrences:      Standing Expiration Date: 03/12/2016  . Comprehensive metabolic panel    Standing Status: Future     Number of Occurrences:      Standing Expiration Date: 03/12/2016  . Lactate dehydrogenase    Standing Status: Future     Number of Occurrences:      Standing Expiration Date: 03/12/2016  . SPEP & IFE with QIG    Standing Status: Future     Number of Occurrences:      Standing Expiration Date: 03/12/2016  . Kappa/lambda light chains    Standing Status: Future     Number of Occurrences:      Standing Expiration Date: 03/12/2016  . Beta 2 microglobulin, serum    Standing Status: Future     Number of Occurrences:      Standing Expiration Date: 03/12/2016  . Ferritin    Standing Status: Future     Number of Occurrences:      Standing Expiration Date: 03/12/2016   All questions were answered. The patient knows to call the clinic with any problems, questions or concerns. No barriers to learning was detected. I spent 15 minutes counseling the patient face to face. The total time spent in the appointment was 20 minutes and more than 50% was on counseling and review of test results     [facility], Zenola Dezarn, MD 02/07/2015 1:47 PM

## 2015-12-20 ENCOUNTER — Telehealth: Payer: Self-pay | Admitting: Hematology and Oncology

## 2015-12-20 NOTE — Telephone Encounter (Signed)
LVM FOR PT REGARDING TO mAY 2 APPT MOVED TO MAY 1ST

## 2016-01-29 ENCOUNTER — Other Ambulatory Visit (HOSPITAL_BASED_OUTPATIENT_CLINIC_OR_DEPARTMENT_OTHER): Payer: Medicare Other

## 2016-01-29 ENCOUNTER — Ambulatory Visit (HOSPITAL_COMMUNITY): Admission: RE | Admit: 2016-01-29 | Payer: Medicare Other | Source: Ambulatory Visit

## 2016-01-29 DIAGNOSIS — D472 Monoclonal gammopathy: Secondary | ICD-10-CM | POA: Diagnosis not present

## 2016-01-29 DIAGNOSIS — D509 Iron deficiency anemia, unspecified: Secondary | ICD-10-CM

## 2016-01-29 LAB — COMPREHENSIVE METABOLIC PANEL
ANION GAP: 10 meq/L (ref 3–11)
AST: 13 U/L (ref 5–34)
Albumin: 3.7 g/dL (ref 3.5–5.0)
Alkaline Phosphatase: 46 U/L (ref 40–150)
BILIRUBIN TOTAL: 0.86 mg/dL (ref 0.20–1.20)
BUN: 23 mg/dL (ref 7.0–26.0)
CHLORIDE: 103 meq/L (ref 98–109)
CO2: 28 meq/L (ref 22–29)
CREATININE: 1 mg/dL (ref 0.7–1.3)
Calcium: 9.4 mg/dL (ref 8.4–10.4)
EGFR: 70 mL/min/{1.73_m2} — AB (ref >90)
GLUCOSE: 126 mg/dL (ref 70–140)
Potassium: 3.9 mEq/L (ref 3.5–5.1)
SODIUM: 141 meq/L (ref 136–145)
TOTAL PROTEIN: 6.9 g/dL (ref 6.4–8.3)

## 2016-01-29 LAB — CBC WITH DIFFERENTIAL/PLATELET
BASO%: 1.2 % (ref 0.0–2.0)
Basophils Absolute: 0.1 10*3/uL (ref 0.0–0.1)
EOS ABS: 0.1 10*3/uL (ref 0.0–0.5)
EOS%: 0.9 % (ref 0.0–7.0)
HEMATOCRIT: 35.3 % — AB (ref 38.4–49.9)
HGB: 11.2 g/dL — ABNORMAL LOW (ref 13.0–17.1)
LYMPH#: 1.4 10*3/uL (ref 0.9–3.3)
LYMPH%: 15.2 % (ref 14.0–49.0)
MCH: 28.6 pg (ref 27.2–33.4)
MCHC: 31.8 g/dL — AB (ref 32.0–36.0)
MCV: 89.9 fL (ref 79.3–98.0)
MONO#: 0.6 10*3/uL (ref 0.1–0.9)
MONO%: 6.7 % (ref 0.0–14.0)
NEUT#: 6.8 10*3/uL — ABNORMAL HIGH (ref 1.5–6.5)
NEUT%: 76 % — AB (ref 39.0–75.0)
Platelets: 318 10*3/uL (ref 140–400)
RBC: 3.93 10*6/uL — AB (ref 4.20–5.82)
RDW: 14.6 % (ref 11.0–14.6)
WBC: 9 10*3/uL (ref 4.0–10.3)

## 2016-01-29 LAB — LACTATE DEHYDROGENASE: LDH: 126 U/L (ref 125–245)

## 2016-01-29 LAB — FERRITIN: FERRITIN: 21 ng/mL — AB (ref 22–316)

## 2016-01-30 LAB — KAPPA/LAMBDA LIGHT CHAINS
Ig Kappa Free Light Chain: 18.8 mg/L (ref 3.30–19.40)
Ig Lambda Free Light Chain: 44.16 mg/L — ABNORMAL HIGH (ref 5.71–26.30)
Kappa/Lambda FluidC Ratio: 0.43 (ref 0.26–1.65)

## 2016-01-30 LAB — BETA 2 MICROGLOBULIN, SERUM: BETA 2: 2 mg/L (ref 0.6–2.4)

## 2016-01-31 LAB — MULTIPLE MYELOMA PANEL, SERUM
ALBUMIN/GLOB SERPL: 1.3 (ref 0.7–1.7)
Albumin SerPl Elph-Mcnc: 3.8 g/dL (ref 2.9–4.4)
Alpha 1: 0.2 g/dL (ref 0.0–0.4)
Alpha2 Glob SerPl Elph-Mcnc: 0.8 g/dL (ref 0.4–1.0)
B-GLOBULIN SERPL ELPH-MCNC: 1.5 g/dL — AB (ref 0.7–1.3)
Gamma Glob SerPl Elph-Mcnc: 0.6 g/dL (ref 0.4–1.8)
Globulin, Total: 3.1 g/dL (ref 2.2–3.9)
IgA, Qn, Serum: 964 mg/dL — ABNORMAL HIGH (ref 61–437)
IgG, Qn, Serum: 572 mg/dL — ABNORMAL LOW (ref 700–1600)
IgM, Qn, Serum: 29 mg/dL (ref 15–143)
M Protein SerPl Elph-Mcnc: 0.8 g/dL — ABNORMAL HIGH
Total Protein: 6.9 g/dL (ref 6.0–8.5)

## 2016-02-04 ENCOUNTER — Ambulatory Visit (HOSPITAL_BASED_OUTPATIENT_CLINIC_OR_DEPARTMENT_OTHER): Payer: Medicare Other | Admitting: Hematology and Oncology

## 2016-02-04 ENCOUNTER — Other Ambulatory Visit: Payer: Self-pay | Admitting: Hematology and Oncology

## 2016-02-04 ENCOUNTER — Encounter: Payer: Self-pay | Admitting: Hematology and Oncology

## 2016-02-04 ENCOUNTER — Telehealth: Payer: Self-pay | Admitting: Hematology and Oncology

## 2016-02-04 VITALS — BP 154/71 | HR 71 | Temp 98.2°F | Resp 18 | Wt 163.7 lb

## 2016-02-04 DIAGNOSIS — Z85820 Personal history of malignant melanoma of skin: Secondary | ICD-10-CM

## 2016-02-04 DIAGNOSIS — D509 Iron deficiency anemia, unspecified: Secondary | ICD-10-CM | POA: Diagnosis not present

## 2016-02-04 DIAGNOSIS — D472 Monoclonal gammopathy: Secondary | ICD-10-CM | POA: Diagnosis not present

## 2016-02-04 NOTE — Assessment & Plan Note (Signed)
I recommend close follow-up with dermatologist. I reinforced the importance of wearing long sleeve shirt, using sunscreen and avoid excessive sun exposure  

## 2016-02-04 NOTE — Assessment & Plan Note (Signed)
Clinically, he has early stage disease. I discussed with him and his wife the natural history of MGUS. He has no evidence of organ damage. We'll see him back in 12 months with repeat blood work and physical examination.   

## 2016-02-04 NOTE — Progress Notes (Signed)
[facility] OFFICE PROGRESS NOTE  Patient Care Team: Shon Baton, MD as PCP - General (Internal Medicine)  SUMMARY OF ONCOLOGIC HISTORY: The patient was referred here because of abnormal serum protein electrophoresis. This patient was noted to have abnormal total protein level and subsequent additional workup showed IgA lambda MGUS. He was being observed He was anemic in the past and had been taking iron supplement for long time. He had EGD and colonoscopy in the past, the last one in 2012 showed erosive gastritis Patient had history of recurrent melanoma and had recent skin removal near his left temple, complicated by poor wound healing. He has close followup with dermatologist.  INTERVAL HISTORY: Please see below for problem oriented charting. He denies recent infection. No bone pain or recent fractures. No new skin lesions. The patient denies any recent signs or symptoms of bleeding such as spontaneous epistaxis, hematuria or hematochezia.   REVIEW OF SYSTEMS:   Constitutional: Denies fevers, chills or abnormal weight loss Eyes: Denies blurriness of vision Ears, nose, mouth, throat, and face: Denies mucositis or sore throat Respiratory: Denies cough, dyspnea or wheezes Cardiovascular: Denies palpitation, chest discomfort or lower extremity swelling Gastrointestinal:  Denies nausea, heartburn or change in bowel habits Skin: Denies abnormal skin rashes Lymphatics: Denies new lymphadenopathy or easy bruising Neurological:Denies numbness, tingling or new weaknesses Behavioral/Psych: Mood is stable, no new changes  All other systems were reviewed with the patient and are negative.  I have reviewed the past medical history, past surgical history, social history and family history with the patient and they are unchanged from previous note.  ALLERGIES:  is allergic to statins.  MEDICATIONS:  Current Outpatient Prescriptions  Medication Sig Dispense Refill  .  amLODipine (NORVASC) 5 MG tablet Take 5 mg by mouth daily.      Marland Kitchen arginine 500 MG tablet Take 500 mg by mouth daily.      Marland Kitchen aspirin EC 81 MG tablet Take 81 mg by mouth daily.    . Cholecalciferol (VITAMIN D3) 1000 UNITS CAPS Take 1 capsule by mouth daily.      . Coenzyme Q10 (CO Q-10) 100 MG CAPS Take by mouth.    . fish oil-omega-3 fatty acids 1000 MG capsule Take 2 g by mouth 3 (three) times daily.      . Garlic TABS Take 1 tablet by mouth daily. Reported on 02/04/2016    . Melatonin 3 MG CAPS Take 1 capsule by mouth as needed.     . metFORMIN (GLUCOPHAGE) 500 MG tablet Take 1,000 mg by mouth 2 (two) times daily with a meal.      . mirabegron ER (MYRBETRIQ) 25 MG TB24 tablet Take 25 mg by mouth daily.    . Multiple Vitamins-Minerals (VISION FORMULA) TABS Take 1 tablet by mouth daily.      Marland Kitchen omeprazole (PRILOSEC) 20 MG capsule Take 20 mg by mouth daily.      . polyethylene glycol powder (MIRALAX) powder Take 17 g by mouth. As needed     . Tamsulosin HCl (FLOMAX) 0.4 MG CAPS Take 0.4 mg by mouth daily.      . traZODone (DESYREL) 50 MG tablet Take 50 mg by mouth at bedtime.    . colesevelam (WELCHOL) 625 MG tablet Take 1,875 mg by mouth 2 (two) times daily as needed. Reported on 02/04/2016     No current facility-administered medications for this visit.    PHYSICAL EXAMINATION: ECOG PERFORMANCE STATUS: 0 - Asymptomatic  Filed Vitals:  02/04/16 1046  BP: 154/71  Pulse: 71  Temp: 98.2 F (36.8 C)  Resp: 18   Filed Weights   02/04/16 1046  Weight: 163 lb 11.2 oz (74.254 kg)    GENERAL:alert, no distress and comfortable SKIN: Noted skin persists and thin skin. Noted solar keratosis EYES: normal, Conjunctiva are pink and non-injected, sclera clear OROPHARYNX:no exudate, no erythema and lips, buccal mucosa, and tongue normal  NECK: supple, thyroid normal size, non-tender, without nodularity LYMPH:  no palpable lymphadenopathy in the cervical, axillary or inguinal LUNGS: clear to  auscultation and percussion with normal breathing effort HEART: regular rate & rhythm and no murmurs and no lower extremity edema ABDOMEN:abdomen soft, non-tender and normal bowel sounds Musculoskeletal:no cyanosis of digits and no clubbing  NEURO: alert & oriented x 3 with fluent speech, no focal motor/sensory deficits  LABORATORY DATA:  I have reviewed the data as listed    Component Value Date/Time   NA 141 01/29/2016 1138   NA 136 08/24/2008 0001   K 3.9 01/29/2016 1138   K 3.9 08/24/2008 0001   CL 98 08/24/2008 0001   CO2 28 01/29/2016 1138   CO2 27 08/23/2008 2350   GLUCOSE 126 01/29/2016 1138   GLUCOSE 175* 08/24/2008 0001   BUN 23.0 01/29/2016 1138   BUN 18 08/24/2008 0001   CREATININE 1.0 01/29/2016 1138   CREATININE 1.2 08/24/2008 0001   CALCIUM 9.4 01/29/2016 1138   CALCIUM 9.2 08/23/2008 2350   PROT 6.9 01/29/2016 1138   PROT 6.9 01/29/2016 1138   PROT 6.5 08/23/2008 2350   ALBUMIN 3.7 01/29/2016 1138   ALBUMIN 3.5 08/23/2008 2350   AST 13 01/29/2016 1138   AST 18 08/23/2008 2350   ALT <9 01/29/2016 1138   ALT 13 08/23/2008 2350   ALKPHOS 46 01/29/2016 1138   ALKPHOS 54 08/23/2008 2350   BILITOT 0.86 01/29/2016 1138   BILITOT 1.1 08/23/2008 2350   GFRNONAA >60 08/23/2008 2350   GFRAA  08/23/2008 2350    >60        The eGFR has been calculated using the MDRD equation. This calculation has not been validated in all clinical    No results found for: SPEP, UPEP  Lab Results  Component Value Date   WBC 9.0 01/29/2016   NEUTROABS 6.8* 01/29/2016   HGB 11.2* 01/29/2016   HCT 35.3* 01/29/2016   MCV 89.9 01/29/2016   PLT 318 01/29/2016      Chemistry      Component Value Date/Time   NA 141 01/29/2016 1138   NA 136 08/24/2008 0001   K 3.9 01/29/2016 1138   K 3.9 08/24/2008 0001   CL 98 08/24/2008 0001   CO2 28 01/29/2016 1138   CO2 27 08/23/2008 2350   BUN 23.0 01/29/2016 1138   BUN 18 08/24/2008 0001   CREATININE 1.0 01/29/2016 1138    CREATININE 1.2 08/24/2008 0001      Component Value Date/Time   CALCIUM 9.4 01/29/2016 1138   CALCIUM 9.2 08/23/2008 2350   ALKPHOS 46 01/29/2016 1138   ALKPHOS 54 08/23/2008 2350   AST 13 01/29/2016 1138   AST 18 08/23/2008 2350   ALT <9 01/29/2016 1138   ALT 13 08/23/2008 2350   BILITOT 0.86 01/29/2016 1138   BILITOT 1.1 08/23/2008 2350       ASSESSMENT & PLAN:  MGUS (monoclonal gammopathy of unknown significance) Clinically, he has early stage disease. I discussed with him and his wife the natural history of MGUS. He has  no evidence of organ damage. We'll see him back in 12 months with repeat blood work and physical examination.   Iron deficiency anemia He is on antiplatelet agents.  The most likely cause of his mild iron deficiency anemia is due to chronic GI bleed Last year, he has near complete resolution of iron deficiency anemia. He is now becoming anemic again with mild iron deficiency. I recommend he resume taking oral iron supplement daily  History of melanoma I recommend close follow-up with dermatologist. I reinforced the importance of wearing long sleeve shirt, using sunscreen and avoid excessive sun exposure    Orders Placed This Encounter  Procedures  . CBC & Diff and Retic    Standing Status: Future     Number of Occurrences:      Standing Expiration Date: 03/10/2017  . Comprehensive metabolic panel    Standing Status: Future     Number of Occurrences:      Standing Expiration Date: 03/10/2017  . Kappa/lambda light chains    Standing Status: Future     Number of Occurrences:      Standing Expiration Date: 03/10/2017  . Multiple Myeloma Panel (SPEP&IFE w/QIG)    Standing Status: Future     Number of Occurrences:      Standing Expiration Date: 03/10/2017  . Ferritin    Standing Status: Future     Number of Occurrences:      Standing Expiration Date: 03/10/2017   All questions were answered. The patient knows to call the clinic with any problems,  questions or concerns. No barriers to learning was detected. I spent 15 minutes counseling the patient face to face. The total time spent in the appointment was 20 minutes and more than 50% was on counseling and review of test results     [facility], Frederica Chrestman, MD 02/04/2016 11:22 AM

## 2016-02-04 NOTE — Telephone Encounter (Signed)
Gave and printed appt sched and avs for pt for April and May 2018

## 2016-02-04 NOTE — Assessment & Plan Note (Signed)
He is on antiplatelet agents.  The most likely cause of his mild iron deficiency anemia is due to chronic GI bleed Last year, he has near complete resolution of iron deficiency anemia. He is now becoming anemic again with mild iron deficiency. I recommend he resume taking oral iron supplement daily

## 2016-02-05 ENCOUNTER — Ambulatory Visit: Payer: Medicare Other | Admitting: Hematology and Oncology

## 2016-04-07 IMAGING — CR DG BONE SURVEY MET
9 of 10 series · 9 of 10 positions shown · non-contrast
Comparison: CT head 11/12/2013, 08/23/2008

CLINICAL DATA: Monoclonal gammopathy of unknown significance,
personal history of coronary artery disease, diabetes mellitus,
hypertension, hyperlipidemia, GERD, iron deficiency anemia

EXAM:
METASTATIC BONE SURVEY

[w chest pa]
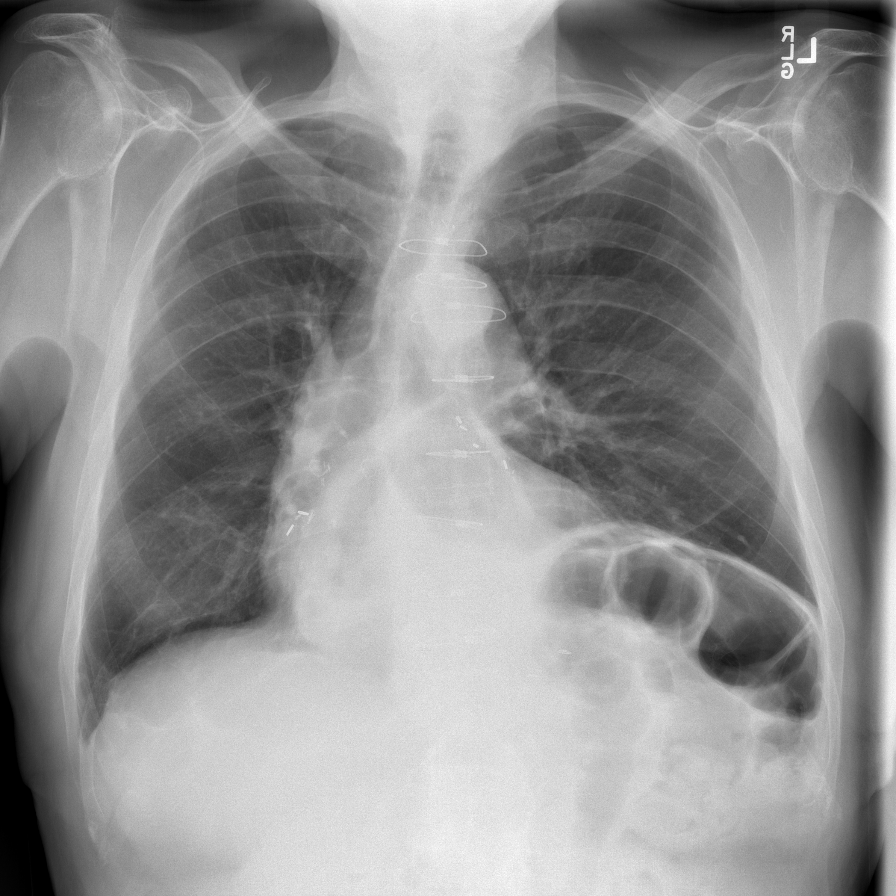

[w c-spine lat]
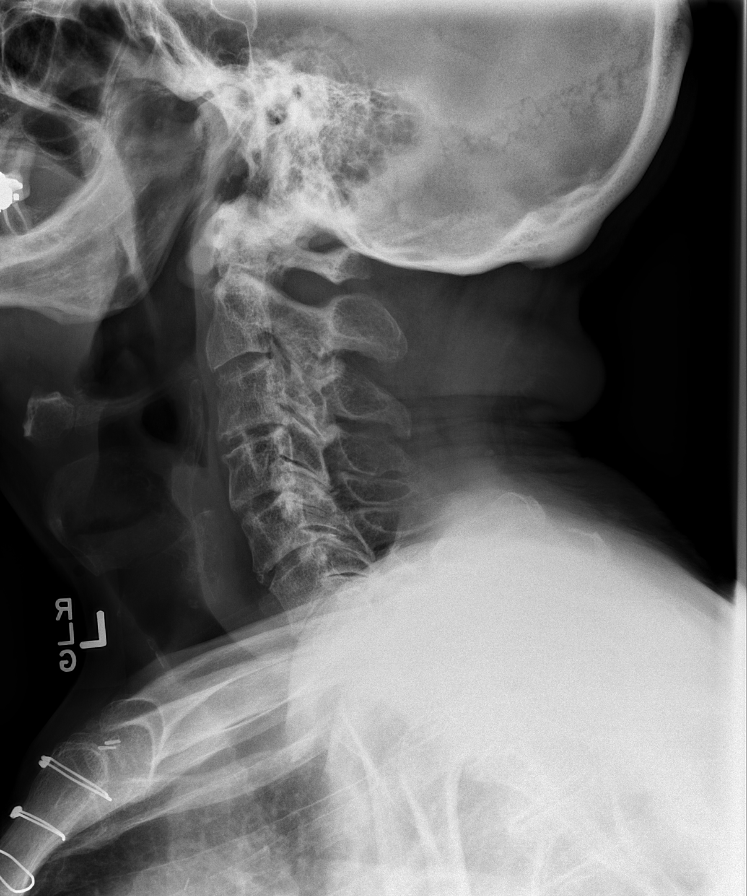

[w skull lat]
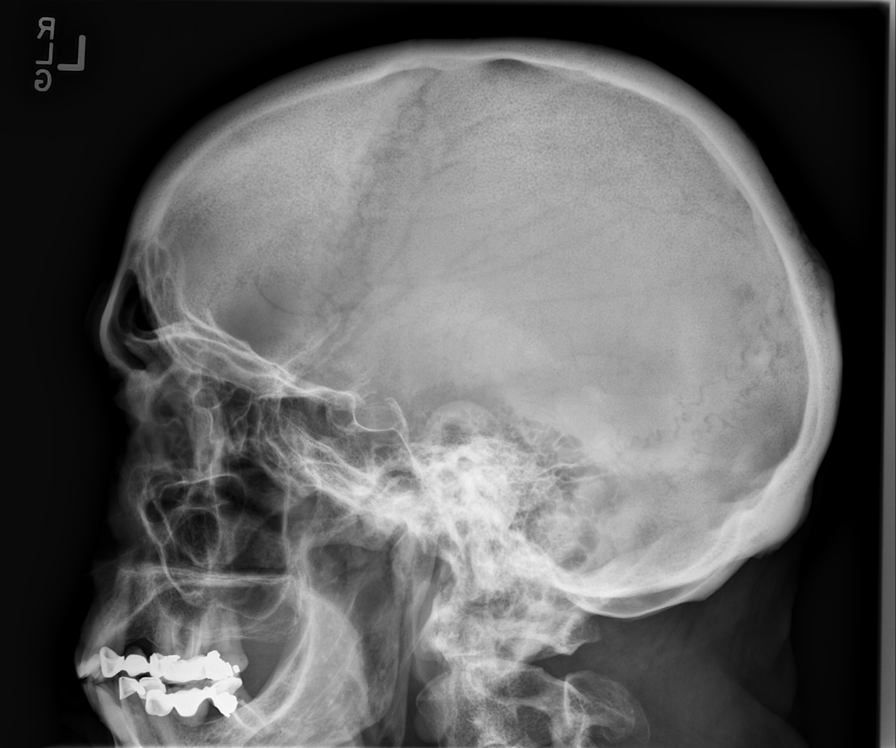

[w c-spine a.p.]
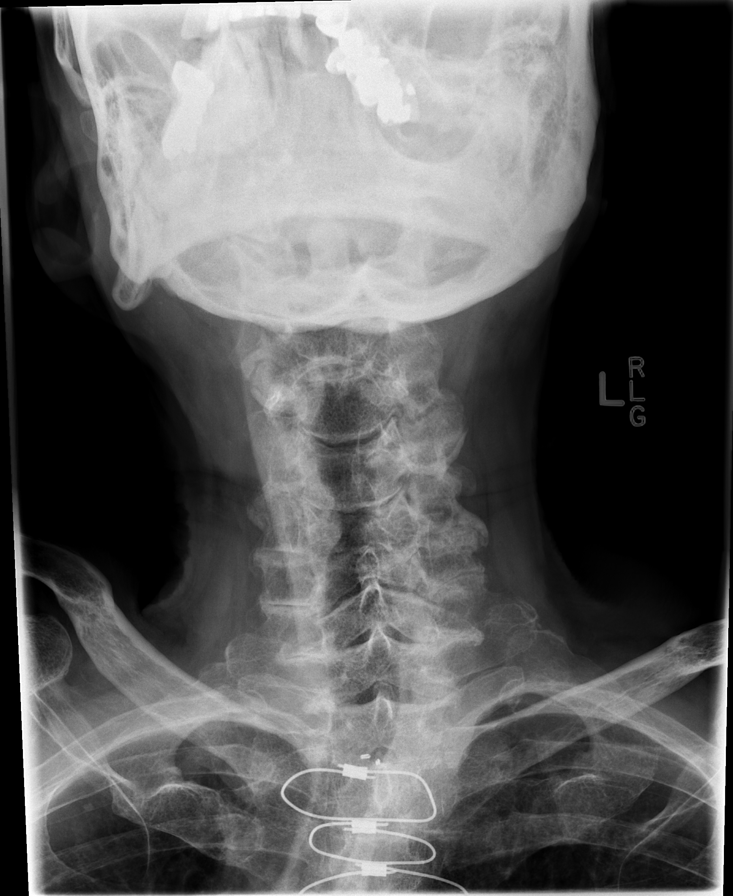

[w shoulder ap external left]
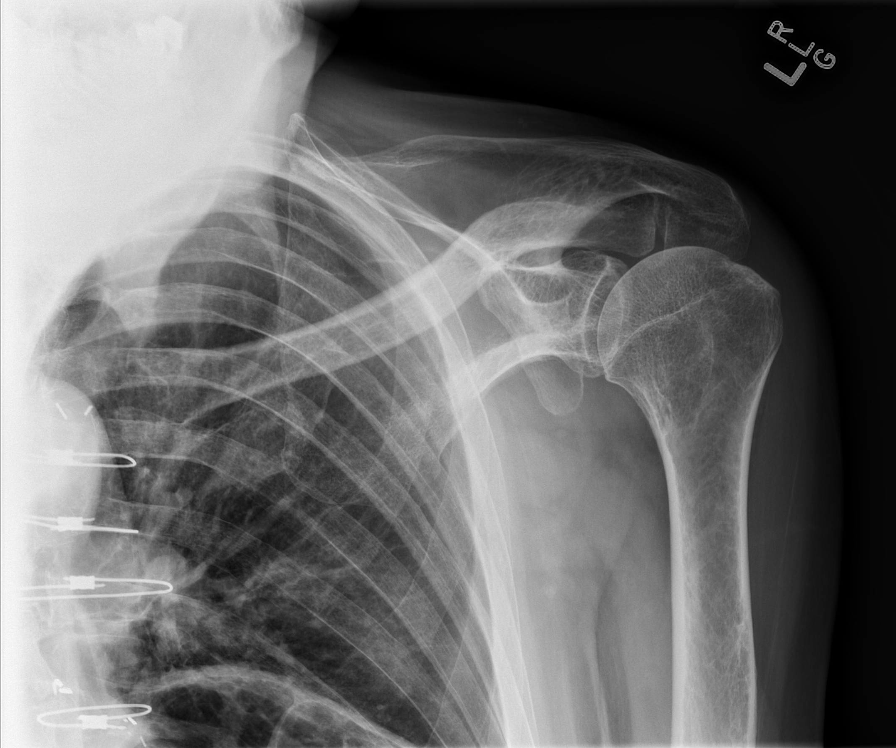

[w shoulder ap external right]
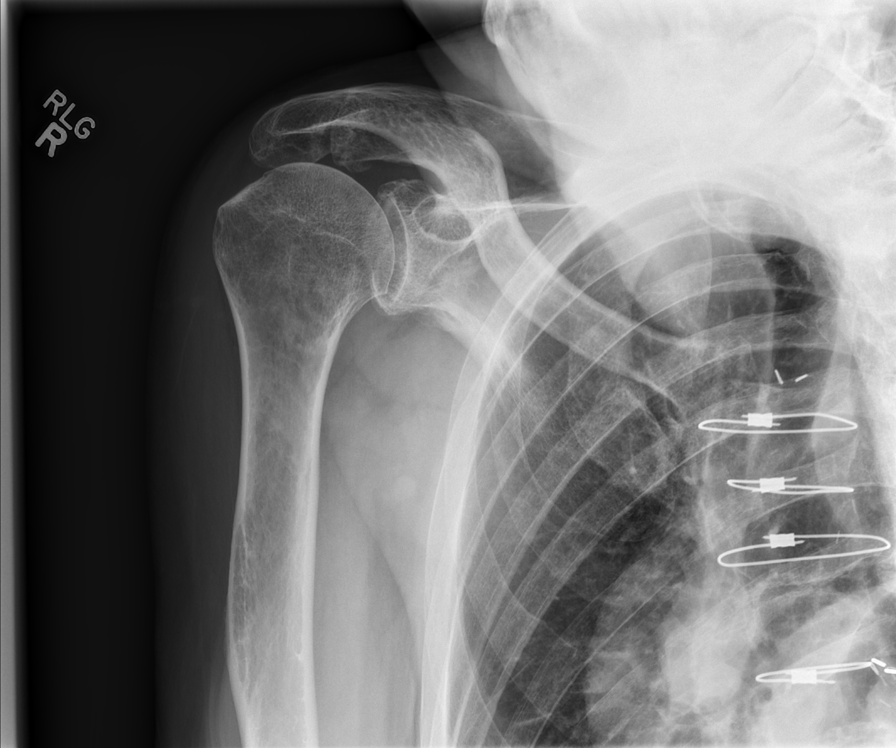

[w humerus ap right]
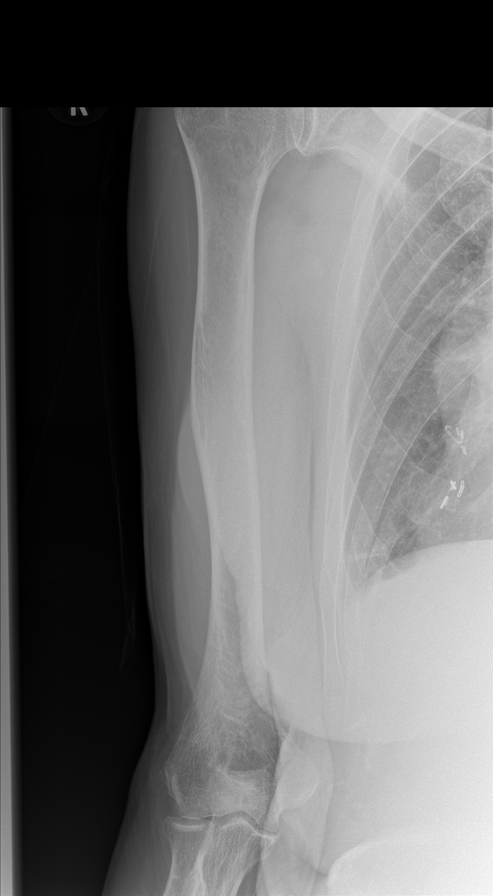

[w humerus ap left]
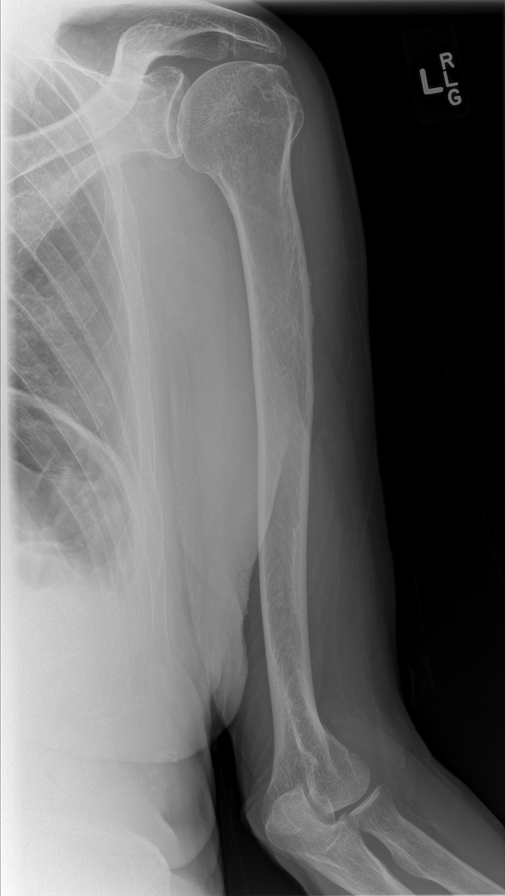

[x forearm ap left]
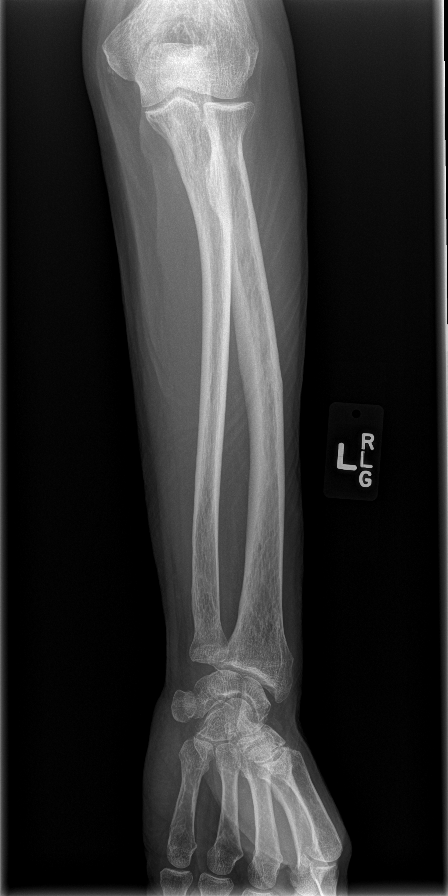

[9 of 10 positions shown; findings below may reference images not displayed]

FINDINGS: Subtle lucent focus at posterior parietal region on lateral view
stable since 2667 CT image 19.

Diffuse osseous demineralization.

Prior median sternotomy and CABG.

Multilevel degenerative disc disease changes of the thoracolumbar
spine with scattered disc space narrowing and endplate spur
formation.

Mild retrolisthesis at L1-L2, L2-L3, L3-L4.

Scattered atherosclerotic calcifications.

Questionable lytic foci within the distal RIGHT tibial diaphysis and
at the proximal RIGHT femoral diaphysis though these could be
related to osteoporosis.

No other potential destructive lesions identified.
IMPRESSION: Questionable lytic foci at the proximal RIGHT femur and distal RIGHT
tibia versus slightly prominent areas of demineralization related to
osteoporosis.

No other worrisome findings identified.

Multilevel degenerative disc disease changes of the thoracolumbar
spine.

## 2016-07-12 ENCOUNTER — Emergency Department (HOSPITAL_COMMUNITY)
Admission: EM | Admit: 2016-07-12 | Discharge: 2016-07-12 | Disposition: A | Discharge status: Home / Self Care | Payer: Medicare Other | Attending: Emergency Medicine | Admitting: Emergency Medicine

## 2016-07-12 ENCOUNTER — Encounter (HOSPITAL_COMMUNITY): Payer: Self-pay | Admitting: *Deleted

## 2016-07-12 DIAGNOSIS — S8012XA Contusion of left lower leg, initial encounter: Secondary | ICD-10-CM | POA: Insufficient documentation

## 2016-07-12 DIAGNOSIS — Z7982 Long term (current) use of aspirin: Secondary | ICD-10-CM | POA: Insufficient documentation

## 2016-07-12 DIAGNOSIS — I10 Essential (primary) hypertension: Secondary | ICD-10-CM | POA: Diagnosis not present

## 2016-07-12 DIAGNOSIS — Y92410 Unspecified street and highway as the place of occurrence of the external cause: Secondary | ICD-10-CM | POA: Insufficient documentation

## 2016-07-12 DIAGNOSIS — I2510 Atherosclerotic heart disease of native coronary artery without angina pectoris: Secondary | ICD-10-CM | POA: Insufficient documentation

## 2016-07-12 DIAGNOSIS — Z951 Presence of aortocoronary bypass graft: Secondary | ICD-10-CM | POA: Insufficient documentation

## 2016-07-12 DIAGNOSIS — Z7984 Long term (current) use of oral hypoglycemic drugs: Secondary | ICD-10-CM | POA: Insufficient documentation

## 2016-07-12 DIAGNOSIS — Y999 Unspecified external cause status: Secondary | ICD-10-CM | POA: Diagnosis not present

## 2016-07-12 DIAGNOSIS — Z87891 Personal history of nicotine dependence: Secondary | ICD-10-CM | POA: Insufficient documentation

## 2016-07-12 DIAGNOSIS — S8992XA Unspecified injury of left lower leg, initial encounter: Secondary | ICD-10-CM | POA: Diagnosis present

## 2016-07-12 DIAGNOSIS — Y939 Activity, unspecified: Secondary | ICD-10-CM | POA: Insufficient documentation

## 2016-07-12 DIAGNOSIS — E119 Type 2 diabetes mellitus without complications: Secondary | ICD-10-CM | POA: Insufficient documentation
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
## 2016-07-12 NOTE — Discharge Instructions (Signed)
You may feel more soreness in your neck and back tomorrow and the next day.  The swelling on your leg is a contusion and is not dangerous.  It will resolve on its own.  You may follow-up with your doctor.

## 2016-07-12 NOTE — ED Notes (Signed)
Pt stable, ambulatory, states understanding of discharge instructions 
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
## 2017-01-01 ENCOUNTER — Telehealth: Payer: Self-pay | Admitting: Cardiology

## 2017-01-01 NOTE — Telephone Encounter (Signed)
Closed encounter °

## 2017-01-02 ENCOUNTER — Telehealth: Payer: Self-pay | Admitting: Cardiology

## 2017-01-02 NOTE — Telephone Encounter (Signed)
Received records from [facility] for appointment on 01/05/17 with Dr Martinique.  Records put with Dr Hochrein's schedule for 01/05/17. lp

## 2017-01-04 NOTE — Progress Notes (Signed)
Gabriel Lewis Date of Birth: Jun 09, 1932 Medical Record #[id number]  History of Present Illness: Gabriel Lewis is seen for follow CAD. He was last seen 2 years ago. He has a history of CAD. He underwent coronary bypass surgery in 1995 by Dr. Redmond Pulling. This included an LIMA graft to the LAD, saphenous vein graft sequentially to the first and second obtuse marginal vessels, and saphenous vein graft to the right coronary. He was last seen in 2005. At that time he experienced some shoulder and arm discomfort. A stress Myoview study demonstrating inferior basal ischemia with normal ejection fraction.  Subsequent cardiac catheterization demonstrated occlusion of the vein graft to the right coronary which was supplied by good collateral flow. His other grafts were patent. He had evaluation with a Myoview study and Echo in 4/13. Myoview showed basal inferior scar, no ischemia and EF 64%. Echo was unremarkable.   On follow up he reports he is doing well. He is not as active but is planning to walk more. Spends a lot of time in his recliner.  He states he felt better when he was walking. BP has been well controlled. No chest pain or SOB.  He denies any palpitations or dizziness. He is no longer on any lipid lowering therapy. He is statin intolerant and Zetia/Welchol stopped due to weakness.   Current Outpatient Prescriptions on File Prior to Visit  Medication Sig Dispense Refill  . amLODipine (NORVASC) 5 MG tablet Take 5 mg by mouth daily.      Marland Kitchen aspirin EC 81 MG tablet Take 81 mg by mouth at bedtime.     . Cholecalciferol (VITAMIN D3) 1000 UNITS CAPS Take 1,000 Units by mouth daily.     . Coenzyme Q10 (CO Q-10) 100 MG CAPS Take 100 mg by mouth daily.     . fish oil-omega-3 fatty acids 1000 MG capsule Take 2 g by mouth 3 (three) times daily.      . Garlic TABS Take 1 tablet by mouth daily. Reported on 02/04/2016    . Melatonin 3 MG CAPS Take 3 mg by mouth at bedtime as needed (for sleep).     . metFORMIN  (GLUCOPHAGE) 500 MG tablet Take 500 mg by mouth 2 (two) times daily with a meal.     . mirabegron ER (MYRBETRIQ) 25 MG TB24 tablet Take 25 mg by mouth daily.    . Multiple Vitamins-Minerals (VISION FORMULA) TABS Take 1 tablet by mouth daily.      Marland Kitchen omeprazole (PRILOSEC) 20 MG capsule Take 20 mg by mouth daily as needed (for acid reflux or heartburn).     . polyethylene glycol powder (MIRALAX) powder Take 17 g by mouth daily as needed for mild constipation.     . Tamsulosin HCl (FLOMAX) 0.4 MG CAPS Take 0.4 mg by mouth daily.      . traZODone (DESYREL) 50 MG tablet Take 25-50 mg by mouth at bedtime.      No current facility-administered medications on file prior to visit.     Allergies  Allergen Reactions  . Statins Other (See Comments)    myalgias    Past Medical History:  Diagnosis Date  . Arthritis   . BPH (benign prostatic hyperplasia)   . CAD (coronary artery disease)   . Diabetes mellitus   . Diverticulosis   . ED (erectile dysfunction)   . Esophageal stricture   . Gastric ulcer   . GERD (gastroesophageal reflux disease)   . Hiatal hernia   .  Hyperlipidemia   . Hypertension   . Hypogonadism male   . Internal hemorrhoids   . Iron deficiency anemia   . Iron deficiency anemia 07/25/2014  . MGUS (monoclonal gammopathy of unknown significance) 07/25/2014  . Pneumonia     Past Surgical History:  Procedure Laterality Date  . APPENDECTOMY    . BASAL CELL CARCINOMA EXCISION    . CORONARY ARTERY BYPASS GRAFT  1995   LIMA-LAD, svg-OM1-2, svg-rca  . INGUINAL HERNIA REPAIR     Bilateral  . TONSILLECTOMY      History  Smoking Status  . Former Smoker  . Quit date: 10/06/1993  Smokeless Tobacco  . Never Used    History  Alcohol Use No    Family History  Problem Relation Age of Onset  . Heart attack Father     Died at age 50.  . Colon cancer Neg Hx     Review of Systems: As noted in HPI. All other systems were reviewed and are negative.  Physical Exam: BP  133/68   Pulse 73   Ht 5\' 8"  (1.727 m)   Wt 160 lb 6.4 oz (72.8 kg)   BMI 24.39 kg/m  He is a well-developed, elderly male in no acute distress. His HEENT exam is unremarkable.  Neck is supple without JVD, adenopathy, thyromegaly, or bruits. Lungs are clear. Cardiac exam reveals a regular rate and rhythm. There is a harsh grade 0-6/2 systolic ejection murmur at the left lower sternal border.  Abdomen is soft and nontender without masses or hepatosplenomegaly. Femoral and pedal pulses are 2+. He has no cyanosis or edema. Skin is warm and dry. He is alert and oriented x3. Cranial nerves II through XII are intact.  LABORATORY DATA: Lab Results  Component Value Date   WBC 9.0 01/29/2016   HGB 11.2 (L) 01/29/2016   HCT 35.3 (L) 01/29/2016   PLT 318 01/29/2016   GLUCOSE 126 01/29/2016   ALT <9 01/29/2016   AST 13 01/29/2016   NA 141 01/29/2016   K 3.9 01/29/2016   CL 98 08/24/2008   CREATININE 1.0 01/29/2016   BUN 23.0 01/29/2016   CO2 28 01/29/2016   INR 1.1 08/23/2008   Labs dated 04/07/16: cholesterol 194, triglycerides 86, HDL 56, LDL 121. CMET and TSH normal Dated 10/09/16: A1c 6.6%.  ECG today demonstrates normal sinus rhythm with PVCs and a first degree AV block.  ? Lateral infarct with poor R wave in V4-6.  I have personally reviewed and interpreted this study.    Assessment / Plan: 1. CAD s/p remote CABG in 1995. Known occlusion of SVG to RCA in 2005. Last myoview in 2013 showed inferior wall scar without ischemia. EF normal. Currently asymptomatic. Continue medical Rx.  2. Hyperlipidemia. Intolerant to statins. No longer on any lipid lowering therapy. Followed by Dr.Russo.  3. DM improved control with dietary modification and weight loss.   4. Murmur- no significant valvular pathology noted on Echo. Suspect related to some AV sclerosis vs MR.   5. PVCs. Asymptomatic. Chronic.   Disposition. I will follow up in one year. I have encouraged him to get back into a walking  program.

## 2017-01-05 ENCOUNTER — Ambulatory Visit (INDEPENDENT_AMBULATORY_CARE_PROVIDER_SITE_OTHER): Payer: Medicare Other | Admitting: Cardiology

## 2017-01-05 ENCOUNTER — Encounter: Payer: Self-pay | Admitting: Cardiology

## 2017-01-05 VITALS — BP 133/68 | HR 73 | Ht 68.0 in | Wt 160.4 lb

## 2017-01-05 DIAGNOSIS — I25709 Atherosclerosis of coronary artery bypass graft(s), unspecified, with unspecified angina pectoris: Secondary | ICD-10-CM

## 2017-01-05 DIAGNOSIS — E7800 Pure hypercholesterolemia, unspecified: Secondary | ICD-10-CM | POA: Diagnosis not present

## 2017-01-05 DIAGNOSIS — I493 Ventricular premature depolarization: Secondary | ICD-10-CM | POA: Diagnosis not present

## 2017-01-05 NOTE — Patient Instructions (Signed)
Continue your current therapy  Walk more  I will see you in one year

## 2017-01-27 ENCOUNTER — Other Ambulatory Visit (HOSPITAL_BASED_OUTPATIENT_CLINIC_OR_DEPARTMENT_OTHER): Payer: Medicare Other

## 2017-01-27 DIAGNOSIS — D509 Iron deficiency anemia, unspecified: Secondary | ICD-10-CM

## 2017-01-27 DIAGNOSIS — D472 Monoclonal gammopathy: Secondary | ICD-10-CM

## 2017-01-27 LAB — CBC & DIFF AND RETIC
BASO%: 1.5 % (ref 0.0–2.0)
BASOS ABS: 0.1 10*3/uL (ref 0.0–0.1)
EOS ABS: 0.1 10*3/uL (ref 0.0–0.5)
EOS%: 2.6 % (ref 0.0–7.0)
HEMATOCRIT: 35.3 % — AB (ref 38.4–49.9)
HEMOGLOBIN: 11.4 g/dL — AB (ref 13.0–17.1)
Immature Retic Fract: 7.4 % (ref 3.00–10.60)
LYMPH#: 1.4 10*3/uL (ref 0.9–3.3)
LYMPH%: 26.2 % (ref 14.0–49.0)
MCH: 27.8 pg (ref 27.2–33.4)
MCHC: 32.3 g/dL (ref 32.0–36.0)
MCV: 86.1 fL (ref 79.3–98.0)
MONO#: 0.6 10*3/uL (ref 0.1–0.9)
MONO%: 10.8 % (ref 0.0–14.0)
NEUT#: 3.2 10*3/uL (ref 1.5–6.5)
NEUT%: 58.9 % (ref 39.0–75.0)
PLATELETS: 266 10*3/uL (ref 140–400)
RBC: 4.1 10*6/uL — ABNORMAL LOW (ref 4.20–5.82)
RDW: 14.7 % — ABNORMAL HIGH (ref 11.0–14.6)
RETIC CT ABS: 45.51 10*3/uL (ref 34.80–93.90)
Retic %: 1.11 % (ref 0.80–1.80)
WBC: 5.5 10*3/uL (ref 4.0–10.3)

## 2017-01-27 LAB — COMPREHENSIVE METABOLIC PANEL
ALBUMIN: 3.8 g/dL (ref 3.5–5.0)
ALK PHOS: 50 U/L (ref 40–150)
ANION GAP: 9 meq/L (ref 3–11)
AST: 10 U/L (ref 5–34)
BILIRUBIN TOTAL: 1.02 mg/dL (ref 0.20–1.20)
BUN: 21.5 mg/dL (ref 7.0–26.0)
CALCIUM: 10.1 mg/dL (ref 8.4–10.4)
CHLORIDE: 100 meq/L (ref 98–109)
CO2: 30 mEq/L — ABNORMAL HIGH (ref 22–29)
CREATININE: 1.2 mg/dL (ref 0.7–1.3)
EGFR: 57 mL/min/{1.73_m2} — ABNORMAL LOW (ref >90)
Glucose: 125 mg/dl (ref 70–140)
Potassium: 4.2 mEq/L (ref 3.5–5.1)
Sodium: 139 mEq/L (ref 136–145)
Total Protein: 7 g/dL (ref 6.4–8.3)

## 2017-01-27 LAB — FERRITIN: FERRITIN: 11 ng/mL — AB (ref 22–316)

## 2017-01-28 LAB — KAPPA/LAMBDA LIGHT CHAINS
Ig Kappa Free Light Chain: 18 mg/L (ref 3.3–19.4)
Ig Lambda Free Light Chain: 49.5 mg/L — ABNORMAL HIGH (ref 5.7–26.3)
KAPPA/LAMBDA FLC RATIO: 0.36 (ref 0.26–1.65)

## 2017-02-02 LAB — MULTIPLE MYELOMA PANEL, SERUM
ALBUMIN SERPL ELPH-MCNC: 3.6 g/dL (ref 2.9–4.4)
Albumin/Glob SerPl: 1.3 (ref 0.7–1.7)
Alpha 1: 0.1 g/dL (ref 0.0–0.4)
Alpha2 Glob SerPl Elph-Mcnc: 0.7 g/dL (ref 0.4–1.0)
B-Globulin SerPl Elph-Mcnc: 1.5 g/dL — ABNORMAL HIGH (ref 0.7–1.3)
Gamma Glob SerPl Elph-Mcnc: 0.6 g/dL (ref 0.4–1.8)
Globulin, Total: 2.9 g/dL (ref 2.2–3.9)
IGM (IMMUNOGLOBIN M), SRM: 31 mg/dL (ref 15–143)
IgG, Qn, Serum: 541 mg/dL — ABNORMAL LOW (ref 700–1600)
M Protein SerPl Elph-Mcnc: 1 g/dL — ABNORMAL HIGH
TOTAL PROTEIN: 6.5 g/dL (ref 6.0–8.5)

## 2017-02-03 ENCOUNTER — Ambulatory Visit (HOSPITAL_BASED_OUTPATIENT_CLINIC_OR_DEPARTMENT_OTHER): Payer: Medicare Other | Admitting: Hematology and Oncology

## 2017-02-03 ENCOUNTER — Telehealth: Payer: Self-pay | Admitting: Hematology and Oncology

## 2017-02-03 VITALS — BP 144/69 | HR 75 | Temp 97.9°F | Resp 17 | Ht 68.0 in | Wt 162.3 lb

## 2017-02-03 DIAGNOSIS — D509 Iron deficiency anemia, unspecified: Secondary | ICD-10-CM

## 2017-02-03 DIAGNOSIS — Z85820 Personal history of malignant melanoma of skin: Secondary | ICD-10-CM

## 2017-02-03 DIAGNOSIS — D472 Monoclonal gammopathy: Secondary | ICD-10-CM

## 2017-02-03 NOTE — Telephone Encounter (Signed)
Gave patient AVS and calender per 5/1 los.  

## 2017-02-05 ENCOUNTER — Encounter: Payer: Self-pay | Admitting: Hematology and Oncology

## 2017-02-05 NOTE — Assessment & Plan Note (Signed)
Clinically, he has early stage disease. I discussed with him and his wife the natural history of MGUS. He has no evidence of organ damage. Compared to last year, he has mild disease progression We'll see him back in 5 months with repeat blood work and physical examination.

## 2017-02-05 NOTE — Assessment & Plan Note (Signed)
I recommend close follow-up with dermatologist. I reinforced the importance of wearing long sleeve shirt, using sunscreen and avoid excessive sun exposure  

## 2017-02-05 NOTE — Assessment & Plan Note (Signed)
He is on antiplatelet agents.  The most likely cause of his mild iron deficiency anemia is due to chronic GI bleed Last year, he has near complete resolution of iron deficiency anemia. He is now becoming anemic again with mild iron deficiency. I recommend he resume taking oral iron supplement daily We discussed possibility of referral to GI service for evaluation if he has persistent iron deficiency in the next repeat blood work

## 2017-02-05 NOTE — Progress Notes (Signed)
[facility] OFFICE PROGRESS NOTE  Patient Care Team: Shon Baton, MD as PCP - General (Internal Medicine)  SUMMARY OF ONCOLOGIC HISTORY:  The patient was referred here because of abnormal serum protein electrophoresis. This patient was noted to have abnormal total protein level and subsequent additional workup showed IgA lambda MGUS. He was being observed He was anemic in the past and had been taking iron supplement for long time. He had EGD and colonoscopy in the past, the last one in 2012 showed erosive gastritis Patient had history of recurrent melanoma and had recent skin removal near his left temple, complicated by poor wound healing, resolved He has close followup with dermatologist.  INTERVAL HISTORY: Please see below for problem oriented charting. He returns with his wife He is somewhat noncompliant taking oral iron supplement The patient denies any recent signs or symptoms of bleeding such as spontaneous epistaxis, hematuria or hematochezia. He denies recent infection No new bone pain Denies recent skin lesions  REVIEW OF SYSTEMS:   Constitutional: Denies fevers, chills or abnormal weight loss Eyes: Denies blurriness of vision Ears, nose, mouth, throat, and face: Denies mucositis or sore throat Respiratory: Denies cough, dyspnea or wheezes Cardiovascular: Denies palpitation, chest discomfort or lower extremity swelling Gastrointestinal:  Denies nausea, heartburn or change in bowel habits Skin: Denies abnormal skin rashes Lymphatics: Denies new lymphadenopathy or easy bruising Neurological:Denies numbness, tingling or new weaknesses Behavioral/Psych: Mood is stable, no new changes  All other systems were reviewed with the patient and are negative.  I have reviewed the past medical history, past surgical history, social history and family history with the patient and they are unchanged from previous note.  ALLERGIES:  is allergic to statins.  MEDICATIONS:   Current Outpatient Prescriptions  Medication Sig Dispense Refill  . amLODipine (NORVASC) 5 MG tablet Take 5 mg by mouth daily.      Marland Kitchen aspirin EC 81 MG tablet Take 81 mg by mouth at bedtime.     . Cholecalciferol (VITAMIN D3) 1000 UNITS CAPS Take 1,000 Units by mouth daily.     . Coenzyme Q10 (CO Q-10) 100 MG CAPS Take 100 mg by mouth daily.     . fish oil-omega-3 fatty acids 1000 MG capsule Take 2 g by mouth 3 (three) times daily.      . Garlic TABS Take 1 tablet by mouth daily. Reported on 02/04/2016    . Melatonin 3 MG CAPS Take 3 mg by mouth at bedtime as needed (for sleep).     . metFORMIN (GLUCOPHAGE) 500 MG tablet Take 500 mg by mouth 2 (two) times daily with a meal.     . mirabegron ER (MYRBETRIQ) 25 MG TB24 tablet Take 25 mg by mouth daily.    . Multiple Vitamins-Minerals (VISION FORMULA) TABS Take 1 tablet by mouth daily.      Marland Kitchen omeprazole (PRILOSEC) 20 MG capsule Take 20 mg by mouth daily as needed (for acid reflux or heartburn).     . polyethylene glycol powder (MIRALAX) powder Take 17 g by mouth daily as needed for mild constipation.     . Tamsulosin HCl (FLOMAX) 0.4 MG CAPS Take 0.4 mg by mouth daily.      . traZODone (DESYREL) 50 MG tablet Take 25-50 mg by mouth at bedtime.      No current facility-administered medications for this visit.     PHYSICAL EXAMINATION: ECOG PERFORMANCE STATUS: 0 - Asymptomatic  Vitals:   02/03/17 1048  BP: (!) 144/69  Pulse:  75  Resp: 17  Temp: 97.9 F (36.6 C)   Filed Weights   02/03/17 1048  Weight: 162 lb 4.8 oz (73.6 kg)    GENERAL:alert, no distress and comfortable SKIN: skin color, texture, turgor are normal, no rashes or significant lesions EYES: normal, Conjunctiva are pink and non-injected, sclera clear OROPHARYNX:no exudate, no erythema and lips, buccal mucosa, and tongue normal  NECK: supple, thyroid normal size, non-tender, without nodularity LYMPH:  no palpable lymphadenopathy in the cervical, axillary or  inguinal LUNGS: clear to auscultation and percussion with normal breathing effort HEART: regular rate & rhythm and no murmurs and no lower extremity edema ABDOMEN:abdomen soft, non-tender and normal bowel sounds Musculoskeletal:no cyanosis of digits and no clubbing  NEURO: alert & oriented x 3 with fluent speech, no focal motor/sensory deficits  LABORATORY DATA:  I have reviewed the data as listed    Component Value Date/Time   NA 139 01/27/2017 1040   K 4.2 01/27/2017 1040   CL 98 08/24/2008 0001   CO2 30 (H) 01/27/2017 1040   GLUCOSE 125 01/27/2017 1040   BUN 21.5 01/27/2017 1040   CREATININE 1.2 01/27/2017 1040   CALCIUM 10.1 01/27/2017 1040   PROT 6.5 01/27/2017 1040   PROT 7.0 01/27/2017 1040   ALBUMIN 3.8 01/27/2017 1040   AST 10 01/27/2017 1040   ALT <6 01/27/2017 1040   ALKPHOS 50 01/27/2017 1040   BILITOT 1.02 01/27/2017 1040   GFRNONAA >60 08/23/2008 2350   GFRAA  08/23/2008 2350    >60        The eGFR has been calculated using the MDRD equation. This calculation has not been validated in all clinical    No results found for: SPEP, UPEP  Lab Results  Component Value Date   WBC 5.5 01/27/2017   NEUTROABS 3.2 01/27/2017   HGB 11.4 (L) 01/27/2017   HCT 35.3 (L) 01/27/2017   MCV 86.1 01/27/2017   PLT 266 01/27/2017      Chemistry      Component Value Date/Time   NA 139 01/27/2017 1040   K 4.2 01/27/2017 1040   CL 98 08/24/2008 0001   CO2 30 (H) 01/27/2017 1040   BUN 21.5 01/27/2017 1040   CREATININE 1.2 01/27/2017 1040      Component Value Date/Time   CALCIUM 10.1 01/27/2017 1040   ALKPHOS 50 01/27/2017 1040   AST 10 01/27/2017 1040   ALT <6 01/27/2017 1040   BILITOT 1.02 01/27/2017 1040       ASSESSMENT & PLAN:  MGUS (monoclonal gammopathy of unknown significance) Clinically, he has early stage disease. I discussed with him and his wife the natural history of MGUS. He has no evidence of organ damage. Compared to last year, he has mild  disease progression We'll see him back in 5 months with repeat blood work and physical examination.   History of melanoma I recommend close follow-up with dermatologist. I reinforced the importance of wearing long sleeve shirt, using sunscreen and avoid excessive sun exposure   Iron deficiency anemia He is on antiplatelet agents.  The most likely cause of his mild iron deficiency anemia is due to chronic GI bleed Last year, he has near complete resolution of iron deficiency anemia. He is now becoming anemic again with mild iron deficiency. I recommend he resume taking oral iron supplement daily We discussed possibility of referral to GI service for evaluation if he has persistent iron deficiency in the next repeat blood work   Orders Placed This  Encounter  Procedures  . Comprehensive metabolic panel    Standing Status:   Future    Standing Expiration Date:   03/10/2018  . CBC & Diff and Retic    Standing Status:   Future    Standing Expiration Date:   03/10/2018  . Ferritin    Standing Status:   Future    Standing Expiration Date:   03/10/2018  . Kappa/lambda light chains    Standing Status:   Future    Standing Expiration Date:   03/10/2018  . Multiple Myeloma Panel (SPEP&IFE w/QIG)    Standing Status:   Future    Standing Expiration Date:   03/10/2018   All questions were answered. The patient knows to call the clinic with any problems, questions or concerns. No barriers to learning was detected. I spent 15 minutes counseling the patient face to face. The total time spent in the appointment was 20 minutes and more than 50% was on counseling and review of test results     Heath Lark, MD 02/05/2017 5:29 AM

## 2017-07-06 ENCOUNTER — Telehealth: Payer: Self-pay

## 2017-07-06 ENCOUNTER — Other Ambulatory Visit (HOSPITAL_BASED_OUTPATIENT_CLINIC_OR_DEPARTMENT_OTHER): Payer: Medicare Other

## 2017-07-06 DIAGNOSIS — Z85820 Personal history of malignant melanoma of skin: Secondary | ICD-10-CM | POA: Diagnosis not present

## 2017-07-06 DIAGNOSIS — D472 Monoclonal gammopathy: Secondary | ICD-10-CM

## 2017-07-06 DIAGNOSIS — D509 Iron deficiency anemia, unspecified: Secondary | ICD-10-CM

## 2017-07-06 LAB — COMPREHENSIVE METABOLIC PANEL
ALT: 8 U/L (ref 0–55)
AST: 12 U/L (ref 5–34)
Albumin: 3.9 g/dL (ref 3.5–5.0)
Alkaline Phosphatase: 46 U/L (ref 40–150)
Anion Gap: 9 mEq/L (ref 3–11)
BILIRUBIN TOTAL: 1.08 mg/dL (ref 0.20–1.20)
BUN: 25.8 mg/dL (ref 7.0–26.0)
CO2: 32 mEq/L — ABNORMAL HIGH (ref 22–29)
Calcium: 9.5 mg/dL (ref 8.4–10.4)
Chloride: 99 mEq/L (ref 98–109)
Creatinine: 1 mg/dL (ref 0.7–1.3)
EGFR: 69 mL/min/{1.73_m2} — AB (ref >90)
GLUCOSE: 128 mg/dL (ref 70–140)
Potassium: 3.9 mEq/L (ref 3.5–5.1)
SODIUM: 141 meq/L (ref 136–145)
Total Protein: 7 g/dL (ref 6.4–8.3)

## 2017-07-06 LAB — CBC & DIFF AND RETIC
BASO%: 1.5 % (ref 0.0–2.0)
Basophils Absolute: 0.1 10*3/uL (ref 0.0–0.1)
EOS ABS: 0.2 10*3/uL (ref 0.0–0.5)
EOS%: 2.9 % (ref 0.0–7.0)
HEMATOCRIT: 36.4 % — AB (ref 38.4–49.9)
HEMOGLOBIN: 11.6 g/dL — AB (ref 13.0–17.1)
Immature Retic Fract: 4.7 % (ref 3.00–10.60)
LYMPH%: 29.8 % (ref 14.0–49.0)
MCH: 28.7 pg (ref 27.2–33.4)
MCHC: 31.9 g/dL — ABNORMAL LOW (ref 32.0–36.0)
MCV: 90.1 fL (ref 79.3–98.0)
MONO#: 0.6 10*3/uL (ref 0.1–0.9)
MONO%: 11 % (ref 0.0–14.0)
NEUT%: 54.8 % (ref 39.0–75.0)
NEUTROS ABS: 3 10*3/uL (ref 1.5–6.5)
Platelets: 244 10*3/uL (ref 140–400)
RBC: 4.04 10*6/uL — ABNORMAL LOW (ref 4.20–5.82)
RDW: 14.8 % — ABNORMAL HIGH (ref 11.0–14.6)
Retic %: 0.98 % (ref 0.80–1.80)
Retic Ct Abs: 39.59 10*3/uL (ref 34.80–93.90)
WBC: 5.4 10*3/uL (ref 4.0–10.3)
lymph#: 1.6 10*3/uL (ref 0.9–3.3)

## 2017-07-06 LAB — FERRITIN: FERRITIN: 20 ng/mL — AB (ref 22–316)

## 2017-07-06 NOTE — Telephone Encounter (Signed)
Called and left message for patient to call nurse back regarding blood work.

## 2017-07-06 NOTE — Telephone Encounter (Signed)
-----   Message from Heath Lark, MD sent at 07/06/2017  9:56 AM EDT ----- Regarding: low iron ple let him know he is anemic again with low iron I recommend GI consult. If he agrees, let me know and I will place order ----- Message ----- From: Interface, Lab In Three Zero One Sent: 07/06/2017   9:05 AM To: Heath Lark, MD

## 2017-07-07 ENCOUNTER — Encounter: Payer: Self-pay | Admitting: Gastroenterology

## 2017-07-07 ENCOUNTER — Other Ambulatory Visit: Payer: Self-pay | Admitting: Hematology and Oncology

## 2017-07-07 DIAGNOSIS — D509 Iron deficiency anemia, unspecified: Secondary | ICD-10-CM

## 2017-07-07 LAB — KAPPA/LAMBDA LIGHT CHAINS
Ig Kappa Free Light Chain: 16.6 mg/L (ref 3.3–19.4)
Ig Lambda Free Light Chain: 52.3 mg/L — ABNORMAL HIGH (ref 5.7–26.3)
KAPPA/LAMBDA FLC RATIO: 0.32 (ref 0.26–1.65)

## 2017-07-07 NOTE — Telephone Encounter (Signed)
Given below message. Agreeable to a GI consult. Wife states that he has been to [place] before for colonoscopy.

## 2017-07-08 LAB — MULTIPLE MYELOMA PANEL, SERUM
ALBUMIN SERPL ELPH-MCNC: 3.7 g/dL (ref 2.9–4.4)
ALPHA2 GLOB SERPL ELPH-MCNC: 0.7 g/dL (ref 0.4–1.0)
Albumin/Glob SerPl: 1.3 (ref 0.7–1.7)
Alpha 1: 0.2 g/dL (ref 0.0–0.4)
B-GLOBULIN SERPL ELPH-MCNC: 1.6 g/dL — AB (ref 0.7–1.3)
GAMMA GLOB SERPL ELPH-MCNC: 0.5 g/dL (ref 0.4–1.8)
GLOBULIN, TOTAL: 3 g/dL (ref 2.2–3.9)
IGG (IMMUNOGLOBIN G), SERUM: 534 mg/dL — AB (ref 700–1600)
IgA, Qn, Serum: 1001 mg/dL — ABNORMAL HIGH (ref 61–437)
IgM, Qn, Serum: 31 mg/dL (ref 15–143)
M PROTEIN SERPL ELPH-MCNC: 1 g/dL — AB
Total Protein: 6.7 g/dL (ref 6.0–8.5)

## 2017-07-13 ENCOUNTER — Encounter: Payer: Self-pay | Admitting: Hematology and Oncology

## 2017-07-13 ENCOUNTER — Telehealth: Payer: Self-pay

## 2017-07-13 ENCOUNTER — Ambulatory Visit (HOSPITAL_BASED_OUTPATIENT_CLINIC_OR_DEPARTMENT_OTHER): Payer: Medicare Other | Admitting: Hematology and Oncology

## 2017-07-13 DIAGNOSIS — D472 Monoclonal gammopathy: Secondary | ICD-10-CM | POA: Diagnosis not present

## 2017-07-13 DIAGNOSIS — D509 Iron deficiency anemia, unspecified: Secondary | ICD-10-CM | POA: Diagnosis not present

## 2017-07-13 DIAGNOSIS — D500 Iron deficiency anemia secondary to blood loss (chronic): Secondary | ICD-10-CM

## 2017-07-13 DIAGNOSIS — Z85820 Personal history of malignant melanoma of skin: Secondary | ICD-10-CM | POA: Diagnosis not present

## 2017-07-13 NOTE — Telephone Encounter (Signed)
Printed patient avs and calender with upcoming appointments. Per 10/8 sch message

## 2017-07-13 NOTE — Progress Notes (Signed)
[place] OFFICE PROGRESS NOTE  Patient Care Team: Shon Baton, MD as PCP - General (Internal Medicine)  SUMMARY OF ONCOLOGIC HISTORY:  The patient was referred here because of abnormal serum protein electrophoresis. This patient was noted to have abnormal total protein level and subsequent additional workup showed IgA lambda MGUS. He was being observed He was anemic in the past and had been taking iron supplement for long time. He had EGD and colonoscopy in the past, the last one in 2012 showed erosive gastritis He received intravenous iron in 2015 Patient had history of recurrent melanoma and had recent skin removal near his left temple, complicated by poor wound healing, resolved He has close followup with dermatologist.  INTERVAL HISTORY: Please see below for problem oriented charting. He returns for further follow-up Denies recent infection No new bone pain The patient denies any recent signs or symptoms of bleeding such as spontaneous epistaxis, hematuria or hematochezia. His diet is relatively simple and he does not eat much meat He denies pica He follows closely with a dermatologist and denies new skin lesions or melanoma He denies excessive sun exposure  REVIEW OF SYSTEMS:   Constitutional: Denies fevers, chills or abnormal weight loss Eyes: Denies blurriness of vision Ears, nose, mouth, throat, and face: Denies mucositis or sore throat Respiratory: Denies cough, dyspnea or wheezes Cardiovascular: Denies palpitation, chest discomfort or lower extremity swelling Gastrointestinal:  Denies nausea, heartburn or change in bowel habits Lymphatics: Denies new lymphadenopathy or easy bruising Neurological:Denies numbness, tingling or new weaknesses Behavioral/Psych: Mood is stable, no new changes  All other systems were reviewed with the patient and are negative.  I have reviewed the past medical history, past surgical history, social history and family history  with the patient and they are unchanged from previous note.  ALLERGIES:  is allergic to statins.  MEDICATIONS:  Current Outpatient Prescriptions  Medication Sig Dispense Refill  . amLODipine (NORVASC) 5 MG tablet Take 5 mg by mouth daily.      Marland Kitchen aspirin EC 81 MG tablet Take 81 mg by mouth at bedtime.     . Cholecalciferol (VITAMIN D3) 1000 UNITS CAPS Take 1,000 Units by mouth daily.     . Coenzyme Q10 (CO Q-10) 100 MG CAPS Take 100 mg by mouth daily.     . fish oil-omega-3 fatty acids 1000 MG capsule Take 2 g by mouth 3 (three) times daily.      . Garlic TABS Take 1 tablet by mouth daily. Reported on 02/04/2016    . Melatonin 3 MG CAPS Take 3 mg by mouth at bedtime as needed (for sleep).     . metFORMIN (GLUCOPHAGE) 500 MG tablet Take 500 mg by mouth 2 (two) times daily with a meal.     . mirabegron ER (MYRBETRIQ) 25 MG TB24 tablet Take 25 mg by mouth daily.    . Multiple Vitamins-Minerals (VISION FORMULA) TABS Take 1 tablet by mouth daily.      Marland Kitchen omeprazole (PRILOSEC) 20 MG capsule Take 20 mg by mouth daily as needed (for acid reflux or heartburn).     . polyethylene glycol powder (MIRALAX) powder Take 17 g by mouth daily as needed for mild constipation.     . Tamsulosin HCl (FLOMAX) 0.4 MG CAPS Take 0.4 mg by mouth daily.      . traZODone (DESYREL) 50 MG tablet Take 25-50 mg by mouth at bedtime.      No current facility-administered medications for this visit.  PHYSICAL EXAMINATION: ECOG PERFORMANCE STATUS: 1 - Symptomatic but completely ambulatory  Vitals:   07/13/17 1059  BP: (!) 150/68  Pulse: 67  Resp: (!) 24  Temp: 98.3 F (36.8 C)  SpO2: 96%   Filed Weights   07/13/17 1059  Weight: 159 lb 11.2 oz (72.4 kg)    GENERAL:alert, no distress and comfortable SKIN: skin color, texture, turgor are normal, no rashes or significant lesions EYES: normal, Conjunctiva are pink and non-injected, sclera clear OROPHARYNX:no exudate, no erythema and lips, buccal mucosa, and tongue  normal  NECK: supple, thyroid normal size, non-tender, without nodularity LYMPH:  no palpable lymphadenopathy in the cervical, axillary or inguinal LUNGS: clear to auscultation and percussion with normal breathing effort HEART: regular rate & rhythm and no murmurs and no lower extremity edema ABDOMEN:abdomen soft, non-tender and normal bowel sounds Musculoskeletal:no cyanosis of digits and no clubbing  NEURO: alert & oriented x 3 with fluent speech, no focal motor/sensory deficits  LABORATORY DATA:  I have reviewed the data as listed    Component Value Date/Time   NA 141 07/06/2017 0843   K 3.9 07/06/2017 0843   CL 98 08/24/2008 0001   CO2 32 (H) 07/06/2017 0843   GLUCOSE 128 07/06/2017 0843   BUN 25.8 07/06/2017 0843   CREATININE 1.0 07/06/2017 0843   CALCIUM 9.5 07/06/2017 0843   PROT 7.0 07/06/2017 0843   PROT 6.7 07/06/2017 0843   ALBUMIN 3.9 07/06/2017 0843   AST 12 07/06/2017 0843   ALT 8 07/06/2017 0843   ALKPHOS 46 07/06/2017 0843   BILITOT 1.08 07/06/2017 0843   GFRNONAA >60 08/23/2008 2350   GFRAA  08/23/2008 2350    >60        The eGFR has been calculated using the MDRD equation. This calculation has not been validated in all clinical    No results found for: SPEP, UPEP  Lab Results  Component Value Date   WBC 5.4 07/06/2017   NEUTROABS 3.0 07/06/2017   HGB 11.6 (L) 07/06/2017   HCT 36.4 (L) 07/06/2017   MCV 90.1 07/06/2017   PLT 244 07/06/2017      Chemistry      Component Value Date/Time   NA 141 07/06/2017 0843   K 3.9 07/06/2017 0843   CL 98 08/24/2008 0001   CO2 32 (H) 07/06/2017 0843   BUN 25.8 07/06/2017 0843   CREATININE 1.0 07/06/2017 0843      Component Value Date/Time   CALCIUM 9.5 07/06/2017 0843   ALKPHOS 46 07/06/2017 0843   AST 12 07/06/2017 0843   ALT 8 07/06/2017 0843   BILITOT 1.08 07/06/2017 0843      ASSESSMENT & PLAN:  MGUS (monoclonal gammopathy of unknown significance) Clinically, he has early stage disease. I  discussed with him and his wife the natural history of MGUS. He has no evidence of organ damage. Compared to last year, he has minimal signs of disease progression We'll see him back in 12 months with repeat blood work and physical examination.   Iron deficiency anemia He is on antiplatelet agents.  The most likely cause of his mild iron deficiency anemia is due to chronic GI bleed I recommend he resume taking oral iron supplement daily He has been referred to see GI service for evaluation  History of melanoma I recommend close follow-up with dermatologist. I reinforced the importance of wearing long sleeve shirt, using sunscreen and avoid excessive sun exposure    Orders Placed This Encounter  Procedures  .  Comprehensive metabolic panel    Standing Status:   Future    Standing Expiration Date:   08/17/2018  . CBC with Differential/Platelet    Standing Status:   Future    Standing Expiration Date:   08/17/2018  . Ferritin    Standing Status:   Future    Standing Expiration Date:   07/13/2018  . Iron and TIBC    Standing Status:   Future    Standing Expiration Date:   08/17/2018  . Kappa/lambda light chains    Standing Status:   Future    Standing Expiration Date:   08/17/2018  . Multiple Myeloma Panel (SPEP&IFE w/QIG)    Standing Status:   Future    Standing Expiration Date:   08/17/2018   All questions were answered. The patient knows to call the clinic with any problems, questions or concerns. No barriers to learning was detected. I spent 15 minutes counseling the patient face to face. The total time spent in the appointment was 20 minutes and more than 50% was on counseling and review of test results     Heath Lark, MD 07/13/2017 11:11 AM

## 2017-07-13 NOTE — Assessment & Plan Note (Signed)
I recommend close follow-up with dermatologist. I reinforced the importance of wearing long sleeve shirt, using sunscreen and avoid excessive sun exposure  

## 2017-07-13 NOTE — Assessment & Plan Note (Signed)
Clinically, he has early stage disease. I discussed with him and his wife the natural history of MGUS. He has no evidence of organ damage. Compared to last year, he has minimal signs of disease progression We'll see him back in 12 months with repeat blood work and physical examination.

## 2017-07-13 NOTE — Assessment & Plan Note (Signed)
He is on antiplatelet agents.  The most likely cause of his mild iron deficiency anemia is due to chronic GI bleed I recommend he resume taking oral iron supplement daily He has been referred to see GI service for evaluation

## 2017-09-07 ENCOUNTER — Encounter (INDEPENDENT_AMBULATORY_CARE_PROVIDER_SITE_OTHER): Payer: Self-pay

## 2017-09-07 ENCOUNTER — Encounter: Payer: Self-pay | Admitting: Gastroenterology

## 2017-09-07 ENCOUNTER — Ambulatory Visit: Payer: Medicare Other | Admitting: Gastroenterology

## 2017-09-07 VITALS — BP 136/54 | HR 92 | Ht 67.0 in | Wt 158.0 lb

## 2017-09-07 DIAGNOSIS — K257 Chronic gastric ulcer without hemorrhage or perforation: Secondary | ICD-10-CM | POA: Diagnosis not present

## 2017-09-07 DIAGNOSIS — D500 Iron deficiency anemia secondary to blood loss (chronic): Secondary | ICD-10-CM | POA: Diagnosis not present

## 2017-09-07 MED ORDER — NA SULFATE-K SULFATE-MG SULF 17.5-3.13-1.6 GM/177ML PO SOLN: 1.0000 | Freq: Once | ORAL | 0 refills | Status: AC

## 2017-09-07 NOTE — Patient Instructions (Signed)
You have been scheduled for an endoscopy and colonoscopy. Please follow the written instructions given to you at your visit today. Please pick up your prep supplies at the pharmacy within the next 1-3 days. If you use inhalers (even only as needed), please bring them with you on the day of your procedure. Your physician has requested that you go to www.startemmi.com and enter the access code given to you at your visit today. This web site gives a general overview about your procedure. However, you should still follow specific instructions given to you by our office regarding your preparation for the procedure.  Normal BMI (Body Mass Index- based on height and weight) is between 23 and 30. Your BMI today is Body mass index is 24.75 kg/m. Marland Kitchen Please consider follow up  regarding your BMI with your Primary Care Provider.  Thank you for choosing me and [facility].  Pricilla Riffle. Dagoberto Ligas., MD., Marval Regal

## 2017-09-07 NOTE — Progress Notes (Signed)
History of Present Illness: This is an 83 year old male referred by Gabriel Lark, MD for the evaluation of iron deficiency anemia.  He is accompanied by his wife.  He states his appetite decreased several years ago and he lost weight from about 210 to 160 pounds gradually over several years.  He states his weight has been stable about 160 pounds for the past year.  Ferritin=20 in October and ferritin=11 in May.  Cameron erosions noted on prior EGD are the likely source of chronic blood loss and iron deficiency. Denies abdominal pain, constipation, diarrhea, change in stool caliber, melena, hematochezia, nausea, vomiting, dysphagia, reflux symptoms, chest pain.   EGD 05/2011 showed a large hiatal hernia, esophageal stricture, Cameron erosions and gastritis. Colonoscopy 05/2011 showed left colon diverticulosis and internal hemorrhoids  Allergies  Allergen Reactions  . Statins Other (See Comments)    myalgias   Outpatient Medications Prior to Visit  Medication Sig Dispense Refill  . amLODipine (NORVASC) 5 MG tablet Take 5 mg by mouth daily.      . Cholecalciferol (VITAMIN D3) 1000 UNITS CAPS Take 1,000 Units by mouth daily.     . Coenzyme Q10 (CO Q-10) 100 MG CAPS Take 100 mg by mouth daily.     . fish oil-omega-3 fatty acids 1000 MG capsule Take 2 g by mouth 3 (three) times daily.      . Melatonin 3 MG CAPS Take 3 mg by mouth at bedtime as needed (for sleep).     . metFORMIN (GLUCOPHAGE) 500 MG tablet Take 500 mg by mouth 2 (two) times daily with a meal.     . Multiple Vitamins-Minerals (VISION FORMULA) TABS Take 1 tablet by mouth daily.      Marland Kitchen omeprazole (PRILOSEC) 20 MG capsule Take 20 mg by mouth daily as needed (for acid reflux or heartburn).     . polyethylene glycol powder (MIRALAX) powder Take 17 g by mouth daily as needed for mild constipation.     . Tamsulosin HCl (FLOMAX) 0.4 MG CAPS Take 0.4 mg by mouth daily.      . traZODone (DESYREL) 50 MG tablet Take 25-50 mg by mouth at  bedtime.     Marland Kitchen aspirin EC 81 MG tablet Take 81 mg by mouth at bedtime.     . Garlic TABS Take 1 tablet by mouth daily. Reported on 02/04/2016    . mirabegron ER (MYRBETRIQ) 25 MG TB24 tablet Take 25 mg by mouth daily.     No facility-administered medications prior to visit.    Past Medical History:  Diagnosis Date  . Arthritis   . BPH (benign prostatic hyperplasia)   . CAD (coronary artery disease)   . Diabetes mellitus   . Diverticulosis   . ED (erectile dysfunction)   . Esophageal stricture   . Gastric ulcer   . GERD (gastroesophageal reflux disease)   . Hiatal hernia   . Hyperlipidemia   . Hypertension   . Hypogonadism male   . Internal hemorrhoids   . Iron deficiency anemia   . Iron deficiency anemia 07/25/2014  . MGUS (monoclonal gammopathy of unknown significance) 07/25/2014  . Pneumonia    Past Surgical History:  Procedure Laterality Date  . APPENDECTOMY    . BASAL CELL CARCINOMA EXCISION    . CORONARY ARTERY BYPASS GRAFT  1995   LIMA-LAD, svg-OM1-2, svg-rca  . INGUINAL HERNIA REPAIR     Bilateral  . TONSILLECTOMY     Social History   Socioeconomic History  .  Marital status: Married    Spouse name: None  . Number of children: 3  . Years of education: None  . Highest education level: None  Social Needs  . Financial resource strain: None  . Food insecurity - worry: None  . Food insecurity - inability: None  . Transportation needs - medical: None  . Transportation needs - non-medical: None  Occupational History  . Occupation: Retired    Comment: Charity fundraiser  Tobacco Use  . Smoking status: Former Smoker    Last attempt to quit: 10/06/1993    Years since quitting: 23.9  . Smokeless tobacco: Never Used  Substance and Sexual Activity  . Alcohol use: No  . Drug use: No  . Sexual activity: None  Other Topics Concern  . None  Social History Narrative   2 caffeine drinks daily    Family History  Problem Relation Age of Onset  . Heart attack Father         Died at age 41.  . Lung cancer Mother   . Diabetes Sister   . Cancer Paternal Grandmother   . Colon cancer Neg Hx   . Rectal cancer Neg Hx   . Stomach cancer Neg Hx   . Liver cancer Neg Hx   . Esophageal cancer Neg Hx       Review of Systems: Pertinent positive and negative review of systems were noted in the above HPI section. All other review of systems were otherwise negative.   Physical Exam: General: Well developed, well nourished, elderly, no acute distress Head: Normocephalic and atraumatic Eyes:  sclerae anicteric, EOMI Ears: Normal auditory acuity Mouth: No deformity or lesions Neck: Supple, no masses or thyromegaly Lungs: Clear throughout to auscultation Heart: Regular rate and rhythm; no murmurs, rubs or bruits Abdomen: Soft, non tender and non distended. No masses, hepatosplenomegaly or hernias noted. Normal Bowel sounds Rectal: Deferred to colonoscopy Musculoskeletal: Symmetrical with no gross deformities  Skin: No lesions on visible extremities Pulses:  Normal pulses noted Extremities: No clubbing, cyanosis, edema or deformities noted Neurological: Alert oriented x 4, grossly nonfocal Cervical Nodes:  No significant cervical adenopathy Inguinal Nodes: No significant inguinal adenopathy Psychological:  Alert and cooperative. Normal mood and affect  Assessment and Recommendations:  1. Iron deficiency anemia, likely due to chronic GI losses from known Cameron erosions.  Rule out colorectal neoplasms, AVMs, ulcer and other disorders.  Continue iron replacement however hold iron for 5 days prior to bowel prep.  Schedule colonoscopy and EGD. The risks (including bleeding, perforation, infection, missed lesions, medication reactions and possible hospitalization or surgery if complications occur), benefits, and alternatives to colonoscopy with possible biopsy and possible polypectomy were discussed with the patient and they consent to proceed. The risks (including bleeding,  perforation, infection, missed lesions, medication reactions and possible hospitalization or surgery if complications occur), benefits, and alternatives to endoscopy with possible biopsy and possible dilation were discussed with the patient and they consent to proceed.    cc: Gabriel Lark, MD [address] [place], [place] [address]

## 2017-10-08 ENCOUNTER — Telehealth: Payer: Self-pay | Admitting: Hematology and Oncology

## 2017-10-08 NOTE — Telephone Encounter (Signed)
Called regarding 1/14 °

## 2017-10-16 ENCOUNTER — Encounter: Payer: Self-pay | Admitting: Gastroenterology

## 2017-10-19 ENCOUNTER — Inpatient Hospital Stay: Payer: Medicare Other

## 2017-10-19 ENCOUNTER — Inpatient Hospital Stay: Payer: Medicare Other | Attending: Hematology and Oncology | Admitting: Hematology and Oncology

## 2017-10-19 ENCOUNTER — Encounter: Payer: Self-pay | Admitting: Hematology and Oncology

## 2017-10-19 ENCOUNTER — Telehealth: Payer: Self-pay | Admitting: Hematology and Oncology

## 2017-10-19 DIAGNOSIS — R634 Abnormal weight loss: Secondary | ICD-10-CM | POA: Diagnosis not present

## 2017-10-19 DIAGNOSIS — D472 Monoclonal gammopathy: Secondary | ICD-10-CM

## 2017-10-19 DIAGNOSIS — D509 Iron deficiency anemia, unspecified: Secondary | ICD-10-CM

## 2017-10-19 DIAGNOSIS — D500 Iron deficiency anemia secondary to blood loss (chronic): Secondary | ICD-10-CM

## 2017-10-19 DIAGNOSIS — Z85820 Personal history of malignant melanoma of skin: Secondary | ICD-10-CM

## 2017-10-19 DIAGNOSIS — Z79899 Other long term (current) drug therapy: Secondary | ICD-10-CM

## 2017-10-19 LAB — CBC WITH DIFFERENTIAL/PLATELET
Basophils Absolute: 0 10*3/uL (ref 0.0–0.1)
Basophils Relative: 0 %
Eosinophils Absolute: 0.1 10*3/uL (ref 0.0–0.5)
Eosinophils Relative: 1 %
HEMATOCRIT: 41 % (ref 38.4–49.9)
Hemoglobin: 13.3 g/dL (ref 13.0–17.1)
LYMPHS ABS: 1.5 10*3/uL (ref 0.9–3.3)
LYMPHS PCT: 17 %
MCH: 28.5 pg (ref 27.2–33.4)
MCHC: 32.5 g/dL (ref 32.0–36.0)
MCV: 87.6 fL (ref 79.3–98.0)
MONO ABS: 0.6 10*3/uL (ref 0.1–0.9)
Monocytes Relative: 7 %
NEUTROS ABS: 6.5 10*3/uL (ref 1.5–6.5)
Neutrophils Relative %: 75 %
Platelets: 344 10*3/uL (ref 140–400)
RBC: 4.68 MIL/uL (ref 4.20–5.82)
RDW: 14.8 % (ref 11.0–15.6)
WBC: 8.6 10*3/uL (ref 4.0–10.3)

## 2017-10-19 LAB — COMPREHENSIVE METABOLIC PANEL
ALK PHOS: 64 U/L (ref 40–150)
ALT: 10 U/L (ref 0–55)
AST: 12 U/L (ref 5–34)
Albumin: 4.1 g/dL (ref 3.5–5.0)
Anion gap: 14 — ABNORMAL HIGH (ref 3–11)
BUN: 30 mg/dL — AB (ref 7–26)
CALCIUM: 9.9 mg/dL (ref 8.4–10.4)
CO2: 29 mmol/L (ref 22–29)
CREATININE: 1.18 mg/dL (ref 0.70–1.30)
Chloride: 99 mmol/L (ref 98–109)
GFR calc Af Amer: >60 mL/min (ref >60)
GFR, EST NON AFRICAN AMERICAN: 55 mL/min — AB (ref >60)
Glucose, Bld: 116 mg/dL (ref 70–140)
Potassium: 3.9 mmol/L (ref 3.5–5.1)
Sodium: 142 mmol/L (ref 136–145)
TOTAL PROTEIN: 8 g/dL (ref 6.4–8.3)
Total Bilirubin: 1 mg/dL (ref 0.2–1.2)

## 2017-10-19 LAB — IRON AND TIBC
Iron: 97 ug/dL (ref 42–163)
Saturation Ratios: 26 % — ABNORMAL LOW (ref 42–163)
TIBC: 376 ug/dL (ref 202–409)
UIBC: 279 ug/dL

## 2017-10-19 LAB — FERRITIN: FERRITIN: 28 ng/mL (ref 22–316)

## 2017-10-19 NOTE — Assessment & Plan Note (Signed)
Clinically, he has early stage disease. I discussed with him and his wife the natural history of MGUS. He has no evidence of organ damage. His last set of myeloma panel was stable I plan to recheck myeloma panel once a year, due around end of September

## 2017-10-19 NOTE — Progress Notes (Signed)
[facility] OFFICE PROGRESS NOTE  Patient Care Team: Shon Baton, MD as PCP - General (Internal Medicine)  SUMMARY OF ONCOLOGIC HISTORY:  The patient was referred here because of abnormal serum protein electrophoresis. This patient was noted to have abnormal total protein level and subsequent additional workup showed IgA lambda MGUS. He was being observed He was anemic in the past and had been taking iron supplement for long time. He had EGD and colonoscopy in the past, the last one in 2012 showed erosive gastritis He received intravenous iron in 2015 Patient had history of recurrent melanoma and had recent skin removal near his left temple, complicated by poor wound healing, resolved He has close followup with dermatologist.  INTERVAL HISTORY: Please see below for problem oriented charting. He had recent recurrence of iron deficiency anemia He was referred to GI service with plan colonoscopy. Since then, the patient has been worrying constantly about the procedure and is reluctant to pursue it He denies recent changes in bowel habits, abnormal reflux or abdominal pain. The patient denies any recent signs or symptoms of bleeding such as spontaneous epistaxis, hematuria or hematochezia. He denies recent infection. He has progressive weight loss due to poor oral intake but denies loss of appetite.  His wife cannot convince him to eat regular meals.  REVIEW OF SYSTEMS:   Constitutional: Denies fevers, chills  Eyes: Denies blurriness of vision Ears, nose, mouth, throat, and face: Denies mucositis or sore throat Respiratory: Denies cough, dyspnea or wheezes Cardiovascular: Denies palpitation, chest discomfort or lower extremity swelling Gastrointestinal:  Denies nausea, heartburn or change in bowel habits Skin: Denies abnormal skin rashes Lymphatics: Denies new lymphadenopathy or easy bruising Neurological:Denies numbness, tingling or new weaknesses Behavioral/Psych: Mood  is stable, no new changes  All other systems were reviewed with the patient and are negative.  I have reviewed the past medical history, past surgical history, social history and family history with the patient and they are unchanged from previous note.  ALLERGIES:  is allergic to statins.  MEDICATIONS:  Current Outpatient Medications  Medication Sig Dispense Refill  . amLODipine (NORVASC) 5 MG tablet Take 5 mg by mouth daily.      . Cholecalciferol (VITAMIN D3) 1000 UNITS CAPS Take 1,000 Units by mouth daily.     . Coenzyme Q10 (CO Q-10) 100 MG CAPS Take 100 mg by mouth daily.     . fish oil-omega-3 fatty acids 1000 MG capsule Take 2 g by mouth 3 (three) times daily.      . Melatonin 3 MG CAPS Take 3 mg by mouth at bedtime as needed (for sleep).     . metFORMIN (GLUCOPHAGE) 500 MG tablet Take 500 mg by mouth 2 (two) times daily with a meal.     . Multiple Vitamins-Minerals (VISION FORMULA) TABS Take 1 tablet by mouth daily.      Marland Kitchen omeprazole (PRILOSEC) 20 MG capsule Take 20 mg by mouth daily as needed (for acid reflux or heartburn).     . polyethylene glycol powder (MIRALAX) powder Take 17 g by mouth daily as needed for mild constipation.     . Tamsulosin HCl (FLOMAX) 0.4 MG CAPS Take 0.4 mg by mouth daily.      . traZODone (DESYREL) 50 MG tablet Take 25-50 mg by mouth at bedtime.      No current facility-administered medications for this visit.     PHYSICAL EXAMINATION: ECOG PERFORMANCE STATUS: 1 - Symptomatic but completely ambulatory  Vitals:   10/19/17  1438  BP: (!) 145/78  Pulse: 90  Resp: 18  Temp: (!) 97.4 F (36.3 C)  SpO2: 96%   Filed Weights   10/19/17 1438  Weight: 154 lb 1.6 oz (69.9 kg)    GENERAL:alert, no distress and comfortable SKIN: skin color, texture, turgor are normal, no rashes or significant lesions EYES: normal, Conjunctiva are pink and non-injected, sclera clear OROPHARYNX:no exudate, no erythema and lips, buccal mucosa, and tongue normal  NECK:  supple, thyroid normal size, non-tender, without nodularity LYMPH:  no palpable lymphadenopathy in the cervical, axillary or inguinal LUNGS: clear to auscultation and percussion with normal breathing effort HEART: regular rate & rhythm and no murmurs and no lower extremity edema ABDOMEN:abdomen soft, non-tender and normal bowel sounds Musculoskeletal:no cyanosis of digits and no clubbing  NEURO: alert & oriented x 3 with fluent speech, no focal motor/sensory deficits  LABORATORY DATA:  I have reviewed the data as listed    Component Value Date/Time   NA 142 10/19/2017 1405   NA 141 07/06/2017 0843   K 3.9 10/19/2017 1405   K 3.9 07/06/2017 0843   CL 99 10/19/2017 1405   CO2 29 10/19/2017 1405   CO2 32 (H) 07/06/2017 0843   GLUCOSE 116 10/19/2017 1405   GLUCOSE 128 07/06/2017 0843   BUN 30 (H) 10/19/2017 1405   BUN 25.8 07/06/2017 0843   CREATININE 1.18 10/19/2017 1405   CREATININE 1.0 07/06/2017 0843   CALCIUM 9.9 10/19/2017 1405   CALCIUM 9.5 07/06/2017 0843   PROT 8.0 10/19/2017 1405   PROT 7.0 07/06/2017 0843   PROT 6.7 07/06/2017 0843   ALBUMIN 4.1 10/19/2017 1405   ALBUMIN 3.9 07/06/2017 0843   AST 12 10/19/2017 1405   AST 12 07/06/2017 0843   ALT 10 10/19/2017 1405   ALT 8 07/06/2017 0843   ALKPHOS 64 10/19/2017 1405   ALKPHOS 46 07/06/2017 0843   BILITOT 1.0 10/19/2017 1405   BILITOT 1.08 07/06/2017 0843   GFRNONAA 55 (L) 10/19/2017 1405   GFRAA >60 10/19/2017 1405    No results found for: SPEP, UPEP  Lab Results  Component Value Date   WBC 8.6 10/19/2017   NEUTROABS 6.5 10/19/2017   HGB 13.3 10/19/2017   HCT 41.0 10/19/2017   MCV 87.6 10/19/2017   PLT 344 10/19/2017      Chemistry      Component Value Date/Time   NA 142 10/19/2017 1405   NA 141 07/06/2017 0843   K 3.9 10/19/2017 1405   K 3.9 07/06/2017 0843   CL 99 10/19/2017 1405   CO2 29 10/19/2017 1405   CO2 32 (H) 07/06/2017 0843   BUN 30 (H) 10/19/2017 1405   BUN 25.8 07/06/2017 0843    CREATININE 1.18 10/19/2017 1405   CREATININE 1.0 07/06/2017 0843      Component Value Date/Time   CALCIUM 9.9 10/19/2017 1405   CALCIUM 9.5 07/06/2017 0843   ALKPHOS 64 10/19/2017 1405   ALKPHOS 46 07/06/2017 0843   AST 12 10/19/2017 1405   AST 12 07/06/2017 0843   ALT 10 10/19/2017 1405   ALT 8 07/06/2017 0843   BILITOT 1.0 10/19/2017 1405   BILITOT 1.08 07/06/2017 0843      ASSESSMENT & PLAN:  MGUS (monoclonal gammopathy of unknown significance) Clinically, he has early stage disease. I discussed with him and his wife the natural history of MGUS. He has no evidence of organ damage. His last set of myeloma panel was stable I plan to recheck myeloma panel  once a year, due around end of September  Iron deficiency anemia The patient has intermittent recurrent iron deficiency anemia He was referred to GI service for consideration for EGD plus minus colonoscopy The patient has been worrying constantly about the procedure and he does not want to proceed with that Repeat CBC and iron studies showed borderline iron deficiency but anemia has resolved We discussed potential diagnoses that could be missed such as malignancy of the GI tract but at present time, the patient does not want to proceed with evaluation I will inform his GI physician regarding his opinion  Weight loss He has progressive weight loss We discussed the importance of adequate nutritional intake According to his wife, patient has poor appetite and is reluctant to listen to her in regards to additional oral intake We discussed additional workup versus  surveillance, with the understanding that the patient will attempt to increase oral intake as tolerated I plan to see him back in 3 months for further evaluation I will check his thyroid function in his next visit   Orders Placed This Encounter  Procedures  . TSH    Standing Status:   Future    Standing Expiration Date:   11/23/2018  . CBC with  Differential/Platelet    Standing Status:   Future    Standing Expiration Date:   11/23/2018  . Comprehensive metabolic panel    Standing Status:   Future    Standing Expiration Date:   11/23/2018  . Kappa/lambda light chains    Standing Status:   Future    Standing Expiration Date:   11/23/2018  . Multiple Myeloma Panel (SPEP&IFE w/QIG)    Standing Status:   Future    Standing Expiration Date:   11/23/2018  . Iron and TIBC    Standing Status:   Future    Standing Expiration Date:   11/23/2018  . Ferritin    Standing Status:   Future    Standing Expiration Date:   11/23/2018  . Sedimentation rate    Standing Status:   Future    Standing Expiration Date:   11/23/2018  . Reticulocytes ([facility])    Standing Status:   Future    Standing Expiration Date:   11/23/2018  . CBC with Differential/Platelet    Standing Status:   Future    Standing Expiration Date:   11/23/2018   All questions were answered. The patient knows to call the clinic with any problems, questions or concerns. No barriers to learning was detected. I spent 15 minutes counseling the patient face to face. The total time spent in the appointment was 20 minutes and more than 50% was on counseling and review of test results     Heath Lark, MD 10/19/2017 4:12 PM

## 2017-10-19 NOTE — Assessment & Plan Note (Addendum)
He has progressive weight loss We discussed the importance of adequate nutritional intake According to his wife, patient has poor appetite and is reluctant to listen to her in regards to additional oral intake We discussed additional workup versus  surveillance, with the understanding that the patient will attempt to increase oral intake as tolerated I plan to see him back in 3 months for further evaluation I will check his thyroid function in his next visit

## 2017-10-19 NOTE — Assessment & Plan Note (Signed)
The patient has intermittent recurrent iron deficiency anemia He was referred to GI service for consideration for EGD plus minus colonoscopy The patient has been worrying constantly about the procedure and he does not want to proceed with that Repeat CBC and iron studies showed borderline iron deficiency but anemia has resolved We discussed potential diagnoses that could be missed such as malignancy of the GI tract but at present time, the patient does not want to proceed with evaluation I will inform his GI physician regarding his opinion

## 2017-10-19 NOTE — Telephone Encounter (Signed)
Gave patient avs and calendar with appts per 1/14 los.  °

## 2017-10-21 ENCOUNTER — Other Ambulatory Visit (HOSPITAL_COMMUNITY): Payer: Self-pay | Admitting: Otolaryngology

## 2017-10-23 ENCOUNTER — Encounter: Payer: Medicare Other | Admitting: Gastroenterology

## 2017-10-26 ENCOUNTER — Other Ambulatory Visit: Payer: Self-pay | Admitting: Otolaryngology

## 2017-10-26 DIAGNOSIS — H903 Sensorineural hearing loss, bilateral: Secondary | ICD-10-CM

## 2018-01-10 NOTE — Progress Notes (Signed)
[name] Date of Birth: 07-11-1932 Medical Record #[id number]  History of Present Illness: Mr. Dom is seen for follow CAD. He was last seen 2 years ago. He has a history of CAD. He underwent coronary bypass surgery in 1995 by Dr. Redmond Pulling. This included an LIMA graft to the LAD, saphenous vein graft sequentially to the first and second obtuse marginal vessels, and saphenous vein graft to the right coronary. He was last seen in 2005. At that time he experienced some shoulder and arm discomfort. A stress Myoview study demonstrating inferior basal ischemia with normal ejection fraction.  Subsequent cardiac catheterization demonstrated occlusion of the vein graft to the right coronary which was supplied by good collateral flow. His other grafts were patent. He had evaluation with a Myoview study and Echo in 4/13. Myoview showed basal inferior scar, no ischemia and EF 64%. Echo was unremarkable.   On follow up he reports he is doing well. He is still pretty sedentary.  He denies any chest pain or SOB.  He denies any palpitations or dizziness. He is no longer on any lipid lowering therapy. He is statin intolerant and Zetia/Welchol stopped due to weakness.   Current Outpatient Medications on File Prior to Visit  Medication Sig Dispense Refill  . amLODipine (NORVASC) 5 MG tablet Take 5 mg by mouth daily.      . Cholecalciferol (VITAMIN D3) 1000 UNITS CAPS Take 1,000 Units by mouth daily.     . Coenzyme Q10 (CO Q-10) 100 MG CAPS Take 100 mg by mouth daily.     . fish oil-omega-3 fatty acids 1000 MG capsule Take 2 g by mouth 3 (three) times daily.      . Melatonin 3 MG CAPS Take 3 mg by mouth at bedtime as needed (for sleep).     . metFORMIN (GLUCOPHAGE) 500 MG tablet Take 500 mg by mouth 2 (two) times daily with a meal.     . Multiple Vitamins-Minerals (VISION FORMULA) TABS Take 1 tablet by mouth daily.      Marland Kitchen omeprazole (PRILOSEC) 20 MG capsule Take 20 mg by mouth daily as needed (for acid reflux or  heartburn).     . polyethylene glycol powder (MIRALAX) powder Take 17 g by mouth daily as needed for mild constipation.     . Tamsulosin HCl (FLOMAX) 0.4 MG CAPS Take 0.4 mg by mouth daily.      . traZODone (DESYREL) 50 MG tablet Take 25-50 mg by mouth at bedtime.      No current facility-administered medications on file prior to visit.     Allergies  Allergen Reactions  . Statins Other (See Comments)    myalgias    Past Medical History:  Diagnosis Date  . Arthritis   . BPH (benign prostatic hyperplasia)   . CAD (coronary artery disease)   . Diabetes mellitus   . Diverticulosis   . ED (erectile dysfunction)   . Esophageal stricture   . Gastric ulcer   . GERD (gastroesophageal reflux disease)   . Hiatal hernia   . Hyperlipidemia   . Hypertension   . Hypogonadism male   . Internal hemorrhoids   . Iron deficiency anemia   . Iron deficiency anemia 07/25/2014  . MGUS (monoclonal gammopathy of unknown significance) 07/25/2014  . Pneumonia     Past Surgical History:  Procedure Laterality Date  . APPENDECTOMY    . BASAL CELL CARCINOMA EXCISION    . CORONARY ARTERY BYPASS GRAFT  1995   LIMA-LAD,  svg-OM1-2, svg-rca  . INGUINAL HERNIA REPAIR     Bilateral  . TONSILLECTOMY      Social History   Tobacco Use  Smoking Status Former Smoker  . Last attempt to quit: 10/06/1993  . Years since quitting: 24.2  Smokeless Tobacco Never Used    Social History   Substance and Sexual Activity  Alcohol Use No    Family History  Problem Relation Age of Onset  . Heart attack Father        Died at age 84.  . Lung cancer Mother   . Diabetes Sister   . Cancer Paternal Grandmother   . Colon cancer Neg Hx   . Rectal cancer Neg Hx   . Stomach cancer Neg Hx   . Liver cancer Neg Hx   . Esophageal cancer Neg Hx     Review of Systems: As noted in HPI. All other systems were reviewed and are negative.  Physical Exam: BP 128/78 (BP Location: Left Arm, Patient Position: Sitting,  Cuff Size: Normal)   Pulse 72   Ht 5\' 9"  (1.753 m)   Wt 157 lb (71.2 kg)   BMI 23.18 kg/m  GENERAL:  Well appearing, elderly WM in NAD HEENT:  PERRL, EOMI, sclera are clear. Oropharynx is clear. NECK:  No jugular venous distention, carotid upstroke brisk and symmetric, no bruits, no thyromegaly or adenopathy LUNGS:  Clear to auscultation bilaterally CHEST:  Unremarkable HEART:  RRR,  PMI not displaced or sustained,S1 and S2 within normal limits, no S3, no S4: no clicks, no rubs, no murmurs ABD:  Soft, nontender. BS +, no masses or bruits. No hepatomegaly, no splenomegaly EXT:  2 + pulses throughout, no edema, no cyanosis no clubbing SKIN:  Warm and dry.  No rashes NEURO:  Alert and oriented x 3. Cranial nerves II through XII intact. PSYCH:  Cognitively intact    LABORATORY DATA: Lab Results  Component Value Date   WBC 8.6 10/19/2017   HGB 13.3 10/19/2017   HCT 41.0 10/19/2017   PLT 344 10/19/2017   GLUCOSE 116 10/19/2017   ALT 10 10/19/2017   AST 12 10/19/2017   NA 142 10/19/2017   K 3.9 10/19/2017   CL 99 10/19/2017   CREATININE 1.18 10/19/2017   BUN 30 (H) 10/19/2017   CO2 29 10/19/2017   INR 1.1 08/23/2008   Labs dated 04/07/16: cholesterol 194, triglycerides 86, HDL 56, LDL 121. CMET and TSH normal Dated 10/09/16: A1c 6.6%. Dated 04/13/17: cholesterol 198, triglycerides 97, HDL 57, LDL 122.  Dated 11/17/17: A1c 7.2%.  Dated 10/19/17: chemistries and CBC normal.   ECG today demonstrates normal sinus rhythm with PACs and a first degree AV block.  Nonspecific IVCD. Rate 72.   I have personally reviewed and interpreted this study.    Assessment / Plan: 1. CAD s/p remote CABG in 1995. Known occlusion of SVG to RCA in 2005. Last myoview in 2013 showed inferior wall scar without ischemia. EF normal. He remains  asymptomatic. Continue medical Rx. Encourage increase in aerobic activity.  2. Hyperlipidemia. Intolerant to statins/ Welchol/Zetia.  3. DM improved control with  dietary modification and weight loss. A1c 7.2%.   4. Murmur- no significant valvular pathology noted on Echo.   5. PVCs/PACs. Asymptomatic. Chronic.   Disposition. I will follow up in one year.

## 2018-01-11 ENCOUNTER — Ambulatory Visit: Payer: Medicare Other | Admitting: Cardiology

## 2018-01-11 ENCOUNTER — Encounter: Payer: Self-pay | Admitting: Cardiology

## 2018-01-11 VITALS — BP 128/78 | HR 72 | Ht 69.0 in | Wt 157.0 lb

## 2018-01-11 DIAGNOSIS — I25709 Atherosclerosis of coronary artery bypass graft(s), unspecified, with unspecified angina pectoris: Secondary | ICD-10-CM

## 2018-01-11 DIAGNOSIS — E7800 Pure hypercholesterolemia, unspecified: Secondary | ICD-10-CM | POA: Diagnosis not present

## 2018-01-11 DIAGNOSIS — I493 Ventricular premature depolarization: Secondary | ICD-10-CM

## 2018-01-11 NOTE — Patient Instructions (Addendum)
Continue your current therapy   Get active!!  I will see you in on year

## 2018-01-11 NOTE — Addendum Note (Signed)
Addended by: Kathyrn Lass on: 01/11/2018 11:29 AM   Modules accepted: Orders

## 2018-01-18 ENCOUNTER — Inpatient Hospital Stay: Payer: Medicare Other | Attending: Hematology and Oncology | Admitting: Hematology and Oncology

## 2018-01-18 ENCOUNTER — Encounter: Payer: Self-pay | Admitting: Hematology and Oncology

## 2018-01-18 ENCOUNTER — Inpatient Hospital Stay: Payer: Medicare Other

## 2018-01-18 DIAGNOSIS — D509 Iron deficiency anemia, unspecified: Secondary | ICD-10-CM | POA: Diagnosis not present

## 2018-01-18 DIAGNOSIS — R634 Abnormal weight loss: Secondary | ICD-10-CM

## 2018-01-18 DIAGNOSIS — D500 Iron deficiency anemia secondary to blood loss (chronic): Secondary | ICD-10-CM

## 2018-01-18 DIAGNOSIS — Z79899 Other long term (current) drug therapy: Secondary | ICD-10-CM | POA: Insufficient documentation

## 2018-01-18 DIAGNOSIS — D472 Monoclonal gammopathy: Secondary | ICD-10-CM

## 2018-01-18 DIAGNOSIS — K649 Unspecified hemorrhoids: Secondary | ICD-10-CM | POA: Insufficient documentation

## 2018-01-18 DIAGNOSIS — K5900 Constipation, unspecified: Secondary | ICD-10-CM | POA: Diagnosis not present

## 2018-01-18 DIAGNOSIS — Z7984 Long term (current) use of oral hypoglycemic drugs: Secondary | ICD-10-CM | POA: Diagnosis not present

## 2018-01-18 LAB — CBC WITH DIFFERENTIAL/PLATELET
Basophils Absolute: 0.1 10*3/uL (ref 0.0–0.1)
Basophils Relative: 1 %
Eosinophils Absolute: 0.1 10*3/uL (ref 0.0–0.5)
Eosinophils Relative: 2 %
HEMATOCRIT: 38.5 % (ref 38.4–49.9)
HEMOGLOBIN: 12.4 g/dL — AB (ref 13.0–17.1)
LYMPHS ABS: 1.3 10*3/uL (ref 0.9–3.3)
Lymphocytes Relative: 20 %
MCH: 29 pg (ref 27.2–33.4)
MCHC: 32.2 g/dL (ref 32.0–36.0)
MCV: 90.2 fL (ref 79.3–98.0)
MONOS PCT: 11 %
Monocytes Absolute: 0.7 10*3/uL (ref 0.1–0.9)
NEUTROS ABS: 4.4 10*3/uL (ref 1.5–6.5)
NEUTROS PCT: 66 %
Platelets: 275 10*3/uL (ref 140–400)
RBC: 4.27 MIL/uL (ref 4.20–5.82)
RDW: 14.9 % — ABNORMAL HIGH (ref 11.0–14.6)
WBC: 6.7 10*3/uL (ref 4.0–10.3)

## 2018-01-18 LAB — RETICULOCYTES
RBC.: 4.27 MIL/uL (ref 4.20–5.82)
RETIC COUNT ABSOLUTE: 42.7 10*3/uL (ref 34.8–93.9)
Retic Ct Pct: 1 % (ref 0.8–1.8)

## 2018-01-18 LAB — SEDIMENTATION RATE: Sed Rate: 17 mm/hr — ABNORMAL HIGH (ref 0–16)

## 2018-01-18 LAB — IRON AND TIBC
IRON: 100 ug/dL (ref 42–163)
Saturation Ratios: 29 % — ABNORMAL LOW (ref 42–163)
TIBC: 340 ug/dL (ref 202–409)
UIBC: 241 ug/dL

## 2018-01-18 LAB — TSH: TSH: 1.79 u[IU]/mL (ref 0.320–4.118)

## 2018-01-18 LAB — FERRITIN: FERRITIN: 27 ng/mL (ref 22–316)

## 2018-01-18 NOTE — Progress Notes (Signed)
[place] OFFICE PROGRESS NOTE  Patient Care Team: Shon Baton, MD as PCP - General (Internal Medicine)  ASSESSMENT & PLAN:  MGUS (monoclonal gammopathy of unknown significance) Clinically, he has early stage disease. I discussed with him and his wife the natural history of MGUS. He has no evidence of organ damage. His last set of myeloma panel was stable I plan to recheck myeloma panel once a year, due around end of September 2019  Iron deficiency anemia He has recurrent iron deficiency anemia likely secondary to intermittent hemorrhoidal bleeding The patient has made informed decision not to pursue GI workup He has resumed taking oral iron supplement, complicated by constipation We discussed using regular stool softener and laxatives Plan to recheck iron studies again in 6 months   Orders Placed This Encounter  Procedures  . Iron and TIBC    Standing Status:   Future    Standing Expiration Date:   02/22/2019  . Ferritin    Standing Status:   Future    Standing Expiration Date:   02/22/2019    INTERVAL HISTORY: Please see below for problem oriented charting. Since the last time I saw him, he had resumed taking iron supplement due to intermittent hemorrhoidal bleeding He has mild constipation when he take iron supplement. His appetite is stable The patient denies any recent signs or symptoms of bleeding such as spontaneous epistaxis, hematuria or hematochezia. He denies recent infection. No new bone pain.    SUMMARY OF ONCOLOGIC HISTORY:  The patient was referred here because of abnormal serum protein electrophoresis. This patient was noted to have abnormal total protein level and subsequent additional workup showed IgA lambda MGUS. He was being observed He was anemic in the past and had been taking iron supplement for long time. He had EGD and colonoscopy in the past, the last one in 2012 showed erosive gastritis He received intravenous iron in  2015 Patient had history of recurrent melanoma and had recent skin removal near his left temple, complicated by poor wound healing, resolved He has close followup with dermatologist.  REVIEW OF SYSTEMS:   Constitutional: Denies fevers, chills or abnormal weight loss Eyes: Denies blurriness of vision Ears, nose, mouth, throat, and face: Denies mucositis or sore throat Respiratory: Denies cough, dyspnea or wheezes Cardiovascular: Denies palpitation, chest discomfort or lower extremity swelling Gastrointestinal:  Denies nausea, heartburn or change in bowel habits Skin: Denies abnormal skin rashes Lymphatics: Denies new lymphadenopathy or easy bruising Neurological:Denies numbness, tingling or new weaknesses Behavioral/Psych: Mood is stable, no new changes  All other systems were reviewed with the patient and are negative.  I have reviewed the past medical history, past surgical history, social history and family history with the patient and they are unchanged from previous note.  ALLERGIES:  is allergic to statins.  MEDICATIONS:  Current Outpatient Medications  Medication Sig Dispense Refill  . amLODipine (NORVASC) 5 MG tablet Take 5 mg by mouth daily.      . Cholecalciferol (VITAMIN D3) 1000 UNITS CAPS Take 1,000 Units by mouth daily.     . Coenzyme Q10 (CO Q-10) 100 MG CAPS Take 100 mg by mouth daily.     . fish oil-omega-3 fatty acids 1000 MG capsule Take 2 g by mouth 3 (three) times daily.      . Melatonin 3 MG CAPS Take 3 mg by mouth at bedtime as needed (for sleep).     . metFORMIN (GLUCOPHAGE) 500 MG tablet Take 500 mg by mouth 2 (  two) times daily with a meal.     . Multiple Vitamins-Minerals (VISION FORMULA) TABS Take 1 tablet by mouth daily.      Marland Kitchen omeprazole (PRILOSEC) 20 MG capsule Take 20 mg by mouth daily as needed (for acid reflux or heartburn).     . polyethylene glycol powder (MIRALAX) powder Take 17 g by mouth daily as needed for mild constipation.     . Tamsulosin HCl  (FLOMAX) 0.4 MG CAPS Take 0.4 mg by mouth daily.      . traZODone (DESYREL) 50 MG tablet Take 25-50 mg by mouth at bedtime.      No current facility-administered medications for this visit.     PHYSICAL EXAMINATION: ECOG PERFORMANCE STATUS: 1 - Symptomatic but completely ambulatory  Vitals:   01/18/18 1113  BP: (!) 143/77  Pulse: 74  Resp: 18  Temp: 97.7 F (36.5 C)  SpO2: 97%   Filed Weights   01/18/18 1113  Weight: 158 lb 3.2 oz (71.8 kg)    GENERAL:alert, no distress and comfortable NEURO: alert & oriented x 3 with fluent speech, no focal motor/sensory deficits  LABORATORY DATA:  I have reviewed the data as listed    Component Value Date/Time   NA 142 10/19/2017 1405   NA 141 07/06/2017 0843   K 3.9 10/19/2017 1405   K 3.9 07/06/2017 0843   CL 99 10/19/2017 1405   CO2 29 10/19/2017 1405   CO2 32 (H) 07/06/2017 0843   GLUCOSE 116 10/19/2017 1405   GLUCOSE 128 07/06/2017 0843   BUN 30 (H) 10/19/2017 1405   BUN 25.8 07/06/2017 0843   CREATININE 1.18 10/19/2017 1405   CREATININE 1.0 07/06/2017 0843   CALCIUM 9.9 10/19/2017 1405   CALCIUM 9.5 07/06/2017 0843   PROT 8.0 10/19/2017 1405   PROT 7.0 07/06/2017 0843   PROT 6.7 07/06/2017 0843   ALBUMIN 4.1 10/19/2017 1405   ALBUMIN 3.9 07/06/2017 0843   AST 12 10/19/2017 1405   AST 12 07/06/2017 0843   ALT 10 10/19/2017 1405   ALT 8 07/06/2017 0843   ALKPHOS 64 10/19/2017 1405   ALKPHOS 46 07/06/2017 0843   BILITOT 1.0 10/19/2017 1405   BILITOT 1.08 07/06/2017 0843   GFRNONAA 55 (L) 10/19/2017 1405   GFRAA >60 10/19/2017 1405    No results found for: SPEP, UPEP  Lab Results  Component Value Date   WBC 6.7 01/18/2018   NEUTROABS 4.4 01/18/2018   HGB 12.4 (L) 01/18/2018   HCT 38.5 01/18/2018   MCV 90.2 01/18/2018   PLT 275 01/18/2018      Chemistry      Component Value Date/Time   NA 142 10/19/2017 1405   NA 141 07/06/2017 0843   K 3.9 10/19/2017 1405   K 3.9 07/06/2017 0843   CL 99 10/19/2017  1405   CO2 29 10/19/2017 1405   CO2 32 (H) 07/06/2017 0843   BUN 30 (H) 10/19/2017 1405   BUN 25.8 07/06/2017 0843   CREATININE 1.18 10/19/2017 1405   CREATININE 1.0 07/06/2017 0843      Component Value Date/Time   CALCIUM 9.9 10/19/2017 1405   CALCIUM 9.5 07/06/2017 0843   ALKPHOS 64 10/19/2017 1405   ALKPHOS 46 07/06/2017 0843   AST 12 10/19/2017 1405   AST 12 07/06/2017 0843   ALT 10 10/19/2017 1405   ALT 8 07/06/2017 0843   BILITOT 1.0 10/19/2017 1405   BILITOT 1.08 07/06/2017 0843       All questions were answered.  The patient knows to call the clinic with any problems, questions or concerns. No barriers to learning was detected.  I spent 10 minutes counseling the patient face to face. The total time spent in the appointment was 15 minutes and more than 50% was on counseling and review of test results  Heath Lark, MD 01/18/2018 12:03 PM

## 2018-01-18 NOTE — Assessment & Plan Note (Signed)
He has recurrent iron deficiency anemia likely secondary to intermittent hemorrhoidal bleeding The patient has made informed decision not to pursue GI workup He has resumed taking oral iron supplement, complicated by constipation We discussed using regular stool softener and laxatives Plan to recheck iron studies again in 6 months

## 2018-01-18 NOTE — Assessment & Plan Note (Signed)
Clinically, he has early stage disease. I discussed with him and his wife the natural history of MGUS. He has no evidence of organ damage. His last set of myeloma panel was stable I plan to recheck myeloma panel once a year, due around end of September 2019

## 2018-07-05 ENCOUNTER — Inpatient Hospital Stay: Payer: Medicare Other | Attending: Hematology and Oncology

## 2018-07-05 DIAGNOSIS — D509 Iron deficiency anemia, unspecified: Secondary | ICD-10-CM | POA: Insufficient documentation

## 2018-07-05 DIAGNOSIS — D472 Monoclonal gammopathy: Secondary | ICD-10-CM | POA: Insufficient documentation

## 2018-07-05 DIAGNOSIS — D500 Iron deficiency anemia secondary to blood loss (chronic): Secondary | ICD-10-CM

## 2018-07-05 LAB — CBC WITH DIFFERENTIAL/PLATELET
BASOS ABS: 0.1 10*3/uL (ref 0.0–0.1)
Basophils Relative: 1 %
EOS PCT: 1 %
Eosinophils Absolute: 0.1 10*3/uL (ref 0.0–0.5)
HEMATOCRIT: 35.2 % — AB (ref 38.4–49.9)
HEMOGLOBIN: 11.5 g/dL — AB (ref 13.0–17.1)
LYMPHS ABS: 1.3 10*3/uL (ref 0.9–3.3)
LYMPHS PCT: 19 %
MCH: 28.3 pg (ref 27.2–33.4)
MCHC: 32.8 g/dL (ref 32.0–36.0)
MCV: 86.4 fL (ref 79.3–98.0)
Monocytes Absolute: 0.6 10*3/uL (ref 0.1–0.9)
Monocytes Relative: 9 %
NEUTROS ABS: 4.5 10*3/uL (ref 1.5–6.5)
NEUTROS PCT: 70 %
PLATELETS: 289 10*3/uL (ref 140–400)
RBC: 4.08 MIL/uL — AB (ref 4.20–5.82)
RDW: 14.5 % (ref 11.0–14.6)
WBC: 6.6 10*3/uL (ref 4.0–10.3)

## 2018-07-05 LAB — COMPREHENSIVE METABOLIC PANEL
ALT: 7 U/L (ref 0–44)
ANION GAP: 7 (ref 5–15)
AST: 9 U/L — AB (ref 15–41)
Albumin: 3.6 g/dL (ref 3.5–5.0)
Alkaline Phosphatase: 59 U/L (ref 38–126)
BILIRUBIN TOTAL: 0.8 mg/dL (ref 0.3–1.2)
BUN: 20 mg/dL (ref 8–23)
CO2: 35 mmol/L — ABNORMAL HIGH (ref 22–32)
Calcium: 9.7 mg/dL (ref 8.9–10.3)
Chloride: 98 mmol/L (ref 98–111)
Creatinine, Ser: 1.05 mg/dL (ref 0.61–1.24)
Glucose, Bld: 145 mg/dL — ABNORMAL HIGH (ref 70–99)
POTASSIUM: 4.1 mmol/L (ref 3.5–5.1)
Sodium: 140 mmol/L (ref 135–145)
TOTAL PROTEIN: 7.1 g/dL (ref 6.5–8.1)

## 2018-07-05 LAB — IRON AND TIBC
Iron: 66 ug/dL (ref 42–163)
SATURATION RATIOS: 20 % — AB (ref 42–163)
TIBC: 323 ug/dL (ref 202–409)
UIBC: 256 ug/dL

## 2018-07-05 LAB — FERRITIN: Ferritin: 26 ng/mL (ref 24–336)

## 2018-07-06 LAB — MULTIPLE MYELOMA PANEL, SERUM
Albumin SerPl Elph-Mcnc: 3.5 g/dL (ref 2.9–4.4)
Albumin/Glob SerPl: 1.2 (ref 0.7–1.7)
Alpha 1: 0.2 g/dL (ref 0.0–0.4)
Alpha2 Glob SerPl Elph-Mcnc: 0.8 g/dL (ref 0.4–1.0)
B-Globulin SerPl Elph-Mcnc: 1.5 g/dL — ABNORMAL HIGH (ref 0.7–1.3)
Gamma Glob SerPl Elph-Mcnc: 0.6 g/dL (ref 0.4–1.8)
Globulin, Total: 3.1 g/dL (ref 2.2–3.9)
IGA: 1056 mg/dL — AB (ref 61–437)
IGM (IMMUNOGLOBULIN M), SRM: 32 mg/dL (ref 15–143)
IgG (Immunoglobin G), Serum: 638 mg/dL — ABNORMAL LOW (ref 700–1600)
M Protein SerPl Elph-Mcnc: 0.9 g/dL — ABNORMAL HIGH
Total Protein ELP: 6.6 g/dL (ref 6.0–8.5)

## 2018-07-06 LAB — KAPPA/LAMBDA LIGHT CHAINS
KAPPA FREE LGHT CHN: 20.8 mg/L — AB (ref 3.3–19.4)
KAPPA, LAMDA LIGHT CHAIN RATIO: 0.32 (ref 0.26–1.65)
LAMDA FREE LIGHT CHAINS: 64.4 mg/L — AB (ref 5.7–26.3)

## 2018-07-12 ENCOUNTER — Encounter: Payer: Self-pay | Admitting: Hematology and Oncology

## 2018-07-12 ENCOUNTER — Telehealth: Payer: Self-pay | Admitting: Hematology and Oncology

## 2018-07-12 ENCOUNTER — Inpatient Hospital Stay: Payer: Medicare Other | Attending: Hematology and Oncology | Admitting: Hematology and Oncology

## 2018-07-12 DIAGNOSIS — Z7984 Long term (current) use of oral hypoglycemic drugs: Secondary | ICD-10-CM | POA: Diagnosis not present

## 2018-07-12 DIAGNOSIS — D472 Monoclonal gammopathy: Secondary | ICD-10-CM | POA: Diagnosis not present

## 2018-07-12 DIAGNOSIS — D500 Iron deficiency anemia secondary to blood loss (chronic): Secondary | ICD-10-CM

## 2018-07-12 DIAGNOSIS — Z79899 Other long term (current) drug therapy: Secondary | ICD-10-CM | POA: Insufficient documentation

## 2018-07-12 DIAGNOSIS — D509 Iron deficiency anemia, unspecified: Secondary | ICD-10-CM | POA: Insufficient documentation

## 2018-07-12 DIAGNOSIS — Z85820 Personal history of malignant melanoma of skin: Secondary | ICD-10-CM | POA: Insufficient documentation

## 2018-07-12 NOTE — Progress Notes (Signed)
[place] OFFICE PROGRESS NOTE  Patient Care Team: Shon Baton, MD as PCP - General (Internal Medicine)  ASSESSMENT & PLAN:  MGUS (monoclonal gammopathy of unknown significance) Clinically, he has early stage disease. I discussed with him and his wife the natural history of MGUS. He has no evidence of organ damage. His last set of myeloma panel was stable I plan to recheck myeloma panel once a year  Iron deficiency anemia He has multifactorial anemia, with component of mild iron deficiency We discussed dietary change.  History of melanoma I recommend close follow-up with dermatologist. I reinforced the importance of wearing long sleeve shirt, using sunscreen and avoid excessive sun exposure    No orders of the defined types were placed in this encounter.   INTERVAL HISTORY: Please see below for problem oriented charting. He returns with his wife for further follow-up He has poor hearing Denies new bone pain No recent infection, fever or chills No recent fall No new skin lesions He is up-to-date with influenza vaccination  SUMMARY OF ONCOLOGIC HISTORY:  The patient was referred here because of abnormal serum protein electrophoresis. This patient was noted to have abnormal total protein level and subsequent additional workup showed IgA lambda MGUS. He was being observed He was anemic in the past and had been taking iron supplement for long time. He had EGD and colonoscopy in the past, the last one in 2012 showed erosive gastritis He received intravenous iron in 2015 Patient had history of recurrent melanoma  He has close followup with dermatologist.  REVIEW OF SYSTEMS:   Constitutional: Denies fevers, chills or abnormal weight loss Eyes: Denies blurriness of vision Ears, nose, mouth, throat, and face: Denies mucositis or sore throat Respiratory: Denies cough, dyspnea or wheezes Cardiovascular: Denies palpitation, chest discomfort or lower extremity  swelling Gastrointestinal:  Denies nausea, heartburn or change in bowel habits Skin: Denies abnormal skin rashes Lymphatics: Denies new lymphadenopathy or easy bruising Neurological:Denies numbness, tingling or new weaknesses Behavioral/Psych: Mood is stable, no new changes  All other systems were reviewed with the patient and are negative.  I have reviewed the past medical history, past surgical history, social history and family history with the patient and they are unchanged from previous note.  ALLERGIES:  is allergic to statins.  MEDICATIONS:  Current Outpatient Medications  Medication Sig Dispense Refill  . amLODipine (NORVASC) 5 MG tablet Take 5 mg by mouth daily.      . Cholecalciferol (VITAMIN D3) 1000 UNITS CAPS Take 1,000 Units by mouth daily.     . Coenzyme Q10 (CO Q-10) 100 MG CAPS Take 100 mg by mouth daily.     . fish oil-omega-3 fatty acids 1000 MG capsule Take 2 g by mouth 3 (three) times daily.      . Melatonin 3 MG CAPS Take 3 mg by mouth at bedtime as needed (for sleep).     . metFORMIN (GLUCOPHAGE) 500 MG tablet Take 500 mg by mouth 2 (two) times daily with a meal.     . Multiple Vitamins-Minerals (VISION FORMULA) TABS Take 1 tablet by mouth daily.      Marland Kitchen omeprazole (PRILOSEC) 20 MG capsule Take 20 mg by mouth daily as needed (for acid reflux or heartburn).     . polyethylene glycol powder (MIRALAX) powder Take 17 g by mouth daily as needed for mild constipation.     . Tamsulosin HCl (FLOMAX) 0.4 MG CAPS Take 0.4 mg by mouth daily.      Marland Kitchen  traZODone (DESYREL) 50 MG tablet Take 25-50 mg by mouth at bedtime.      No current facility-administered medications for this visit.     PHYSICAL EXAMINATION: ECOG PERFORMANCE STATUS: 0 - Asymptomatic  Vitals:   07/12/18 1103  BP: (!) 146/65  Pulse: 93  Resp: 18  Temp: 98 F (36.7 C)  SpO2: 94%   Filed Weights   07/12/18 1103  Weight: 153 lb 6.4 oz (69.6 kg)    GENERAL:alert, no distress and  comfortable Musculoskeletal:no cyanosis of digits and no clubbing  NEURO: alert & oriented x 3 with fluent speech, no focal motor/sensory deficits  LABORATORY DATA:  I have reviewed the data as listed    Component Value Date/Time   NA 140 07/05/2018 1056   NA 141 07/06/2017 0843   K 4.1 07/05/2018 1056   K 3.9 07/06/2017 0843   CL 98 07/05/2018 1056   CO2 35 (H) 07/05/2018 1056   CO2 32 (H) 07/06/2017 0843   GLUCOSE 145 (H) 07/05/2018 1056   GLUCOSE 128 07/06/2017 0843   BUN 20 07/05/2018 1056   BUN 25.8 07/06/2017 0843   CREATININE 1.05 07/05/2018 1056   CREATININE 1.0 07/06/2017 0843   CALCIUM 9.7 07/05/2018 1056   CALCIUM 9.5 07/06/2017 0843   PROT 7.1 07/05/2018 1056   PROT 7.0 07/06/2017 0843   PROT 6.7 07/06/2017 0843   ALBUMIN 3.6 07/05/2018 1056   ALBUMIN 3.9 07/06/2017 0843   AST 9 (L) 07/05/2018 1056   AST 12 07/06/2017 0843   ALT 7 07/05/2018 1056   ALT 8 07/06/2017 0843   ALKPHOS 59 07/05/2018 1056   ALKPHOS 46 07/06/2017 0843   BILITOT 0.8 07/05/2018 1056   BILITOT 1.08 07/06/2017 0843   GFRNONAA >60 07/05/2018 1056   GFRAA >60 07/05/2018 1056    No results found for: SPEP, UPEP  Lab Results  Component Value Date   WBC 6.6 07/05/2018   NEUTROABS 4.5 07/05/2018   HGB 11.5 (L) 07/05/2018   HCT 35.2 (L) 07/05/2018   MCV 86.4 07/05/2018   PLT 289 07/05/2018      Chemistry      Component Value Date/Time   NA 140 07/05/2018 1056   NA 141 07/06/2017 0843   K 4.1 07/05/2018 1056   K 3.9 07/06/2017 0843   CL 98 07/05/2018 1056   CO2 35 (H) 07/05/2018 1056   CO2 32 (H) 07/06/2017 0843   BUN 20 07/05/2018 1056   BUN 25.8 07/06/2017 0843   CREATININE 1.05 07/05/2018 1056   CREATININE 1.0 07/06/2017 0843      Component Value Date/Time   CALCIUM 9.7 07/05/2018 1056   CALCIUM 9.5 07/06/2017 0843   ALKPHOS 59 07/05/2018 1056   ALKPHOS 46 07/06/2017 0843   AST 9 (L) 07/05/2018 1056   AST 12 07/06/2017 0843   ALT 7 07/05/2018 1056   ALT 8  07/06/2017 0843   BILITOT 0.8 07/05/2018 1056   BILITOT 1.08 07/06/2017 0843      All questions were answered. The patient knows to call the clinic with any problems, questions or concerns. No barriers to learning was detected.  I spent 10 minutes counseling the patient face to face. The total time spent in the appointment was 15 minutes and more than 50% was on counseling and review of test results  Heath Lark, MD 07/12/2018 11:57 AM

## 2018-07-12 NOTE — Assessment & Plan Note (Signed)
He has multifactorial anemia, with component of mild iron deficiency We discussed dietary change.

## 2018-07-12 NOTE — Assessment & Plan Note (Signed)
I recommend close follow-up with dermatologist. I reinforced the importance of wearing long sleeve shirt, using sunscreen and avoid excessive sun exposure

## 2018-07-12 NOTE — Telephone Encounter (Signed)
Gave pt avs and calendar  °

## 2018-07-12 NOTE — Assessment & Plan Note (Signed)
Clinically, he has early stage disease. I discussed with him and his wife the natural history of MGUS. He has no evidence of organ damage. His last set of myeloma panel was stable I plan to recheck myeloma panel once a year

## 2018-12-31 ENCOUNTER — Encounter: Payer: Self-pay | Admitting: Cardiology

## 2019-01-12 ENCOUNTER — Ambulatory Visit: Payer: Medicare Other | Admitting: Cardiology

## 2019-01-28 ENCOUNTER — Other Ambulatory Visit: Payer: Self-pay

## 2019-01-28 ENCOUNTER — Ambulatory Visit (INDEPENDENT_AMBULATORY_CARE_PROVIDER_SITE_OTHER): Payer: Medicare Other

## 2019-01-28 ENCOUNTER — Other Ambulatory Visit: Payer: Self-pay | Admitting: Sports Medicine

## 2019-01-28 ENCOUNTER — Encounter: Payer: Self-pay | Admitting: Sports Medicine

## 2019-01-28 ENCOUNTER — Ambulatory Visit: Payer: Medicare Other | Admitting: Sports Medicine

## 2019-01-28 VITALS — BP 135/74 | HR 87 | Temp 97.7°F | Resp 16 | Ht 68.0 in | Wt 146.0 lb

## 2019-01-28 DIAGNOSIS — M79671 Pain in right foot: Secondary | ICD-10-CM

## 2019-01-28 DIAGNOSIS — M21619 Bunion of unspecified foot: Secondary | ICD-10-CM

## 2019-01-28 DIAGNOSIS — M2141 Flat foot [pes planus] (acquired), right foot: Secondary | ICD-10-CM | POA: Diagnosis not present

## 2019-01-28 DIAGNOSIS — E1142 Type 2 diabetes mellitus with diabetic polyneuropathy: Secondary | ICD-10-CM

## 2019-01-28 DIAGNOSIS — M2042 Other hammer toe(s) (acquired), left foot: Secondary | ICD-10-CM

## 2019-01-28 DIAGNOSIS — M79672 Pain in left foot: Secondary | ICD-10-CM

## 2019-01-28 DIAGNOSIS — M2142 Flat foot [pes planus] (acquired), left foot: Secondary | ICD-10-CM

## 2019-01-28 DIAGNOSIS — L97511 Non-pressure chronic ulcer of other part of right foot limited to breakdown of skin: Secondary | ICD-10-CM

## 2019-01-28 DIAGNOSIS — M2041 Other hammer toe(s) (acquired), right foot: Secondary | ICD-10-CM | POA: Diagnosis not present

## 2019-01-28 DIAGNOSIS — L909 Atrophic disorder of skin, unspecified: Secondary | ICD-10-CM

## 2019-01-28 NOTE — Progress Notes (Signed)
Subjective: Gabriel Lewis is a 85 y.o. male patient seen in office for evaluation of Callus area at bottom of right foot and foot pain especially when attempting to walk barefoot. Reportstoday A1c of 6 and does not check blood sugar at home. Pain is 4-5 out of 10 when attempting to walk without shoe at right foot at callus area and heel. Reports that he has been using a callus pad to area. Denies nausea/fever/vomiting/chills/night sweats/shortness of breath/pain. Patient has no other pedal complaints at this time.  Reports that he is interested in diabetic shoes as well as recommended by his PCP.  Review of Systems  All other systems reviewed and are negative.    Patient Active Problem List   Diagnosis Date Noted  . Weight loss 10/19/2017  . PVC (premature ventricular contraction) 01/05/2017  . Osteopenia 08/08/2014  . MGUS (monoclonal gammopathy of unknown significance) 07/25/2014  . Iron deficiency anemia 07/25/2014  . History of melanoma 07/25/2014  . Murmur 01/14/2012  . Diabetes mellitus   . CAD (coronary artery disease)   . Hyperlipidemia    Current Outpatient Medications on File Prior to Visit  Medication Sig Dispense Refill  . alendronate (FOSAMAX) 70 MG tablet     . amLODipine (NORVASC) 10 MG tablet     . amLODipine (NORVASC) 5 MG tablet Take 5 mg by mouth daily.      . Cholecalciferol (VITAMIN D3) 1000 UNITS CAPS Take 1,000 Units by mouth daily.     . Coenzyme Q10 (CO Q-10) 100 MG CAPS Take 100 mg by mouth daily.     . fish oil-omega-3 fatty acids 1000 MG capsule Take 2 g by mouth 3 (three) times daily.      . Melatonin 3 MG CAPS Take 3 mg by mouth at bedtime as needed (for sleep).     . metFORMIN (GLUCOPHAGE) 500 MG tablet Take 500 mg by mouth 2 (two) times daily with a meal.     . Multiple Vitamins-Minerals (VISION FORMULA) TABS Take 1 tablet by mouth daily.      Marland Kitchen omeprazole (PRILOSEC) 20 MG capsule Take 20 mg by mouth daily as needed (for acid reflux or heartburn).      Marland Kitchen oxybutynin (DITROPAN) 5 MG tablet     . polyethylene glycol powder (MIRALAX) powder Take 17 g by mouth daily as needed for mild constipation.     . Tamsulosin HCl (FLOMAX) 0.4 MG CAPS Take 0.4 mg by mouth daily.      . traZODone (DESYREL) 50 MG tablet Take 25-50 mg by mouth at bedtime.      No current facility-administered medications on file prior to visit.    Allergies  Allergen Reactions  . Statins Other (See Comments)    myalgias    No results found for this or any previous visit (from the past 2160 hour(s)).  Objective: Vitals:   01/28/19 0950  Weight: 146 lb (66.2 kg)  Height: '5\' 8"'  (1.727 m)    General: Patient is awake, alert, oriented x 3 and in no acute distress.  Dermatology: Skin is warm and dry bilateral with a partial thickness ulceration present  Sub met 1 on right once was a callus that he treated with an acid pad that has likely been the cause of this ulceration. Ulceration measures 0.3 cm x 0.3 cm x 0.1 cm. There is a keratotic border with a granular base. The ulceration does not probe to bone. There is no malodor, no active drainage, no erythema, no  edema. No acute signs of infection.   Vascular: Dorsalis Pedis pulse = 1/4 Bilateral,  Posterior Tibial pulse = 1/4 Bilateral,  Capillary Fill Time < 5 seconds  Nails are short thick and discolored consistent with onychomycosis.  Neurologic: Protective sensation diminished bilateral using the 5.07/10g [employer].  Musculosketal:No Pain with palpation to ulcerated area however upon weightbearing patient reports that there is some pain to the ulcer area as well as to his heel that improves when he wears good supportive shoes.  Patient is interested in diabetic shoes. No pain with compression to calves bilateral.  Bunion hammertoe pes planus deformity noted bilateral with significant fat pad atrophy especially on the right.  Xrays, Left/Right foot: Diffuse arthritis.  No bony destruction  suggestive of osteomyelitis. No gas in soft tissues.  Midtarsal breech supportive of pes planus deformity.  Malalignment supportive of bunion and hammertoe deformity.  Soft tissue margins within normal limits.  No other acute findings.  No results for input(s): GRAMSTAIN, LABORGA in the last 8760 hours.  Assessment and Plan:  Problem List Items Addressed This Visit    None    Visit Diagnoses    Ulcer of right foot limited to breakdown of skin (HCC)    -  Primary   Bilateral foot pain       Pes planus of both feet       Diabetic polyneuropathy associated with type 2 diabetes mellitus (HCC)       Bunion       Hammer toes of both feet       Plantar fat pad atrophy         -Examined patient and discussed the progression of the wound and treatment alternatives. -Xrays reviewed - Excisionally dedbrided ulceration at right sub-met 1 to healthy bleeding borders removing nonviable tissue using a sterile chisel blade. Wound measures post debridement as above. Wound was debrided to the level of the dermis with viable wound base exposed to promote healing. Hemostasis was achieved with manuel pressure. Patient tolerated procedure well without any discomfort or anesthesia necessary for this wound debridement.  -Applied antibiotic cream and Band-Aid dressing and advised patient to change the dressing daily consisting of the same. -Advised patient to refrain from using any corn acid pads - Advised patient to go to the ER or return to office if the wound worsens or if constitutional symptoms are present. -[employer] step diabetic shoe order form was completed; office to contact primary care for approval / certification;  Office to arrange shoe fitting and dispensing. -Patient to return to office in for diabetic shoe measurements with insole to offload sub-met 1 bilateral and for wound check or sooner if problems arise.  Landis Martins, DPM

## 2019-01-28 NOTE — Progress Notes (Signed)
   Subjective:    Patient ID: Gabriel Lewis, male    DOB: 10/25/1931, 85 y.o.   MRN: [id number]  HPI    Review of Systems  Musculoskeletal: Positive for arthralgias and myalgias.  Skin: Positive for wound.  All other systems reviewed and are negative.      Objective:   Physical Exam        Assessment & Plan:

## 2019-05-29 NOTE — Progress Notes (Signed)
Gabriel Lewis Date of Birth: 12-Aug-1932 Medical Record [id number]  History of Present Illness: Gabriel Lewis is seen for follow CAD.  He has a history of CAD. He underwent coronary bypass surgery in 1995 by Dr. Redmond Pulling. This included an LIMA graft to the LAD, saphenous vein graft sequentially to the first and second obtuse marginal vessels, and saphenous vein graft to the right coronary. He was last seen in 2005. At that time he experienced some shoulder and arm discomfort. A stress Myoview study demonstrating inferior basal ischemia with normal ejection fraction.  Subsequent cardiac catheterization demonstrated occlusion of the vein graft to the right coronary which was supplied by good collateral flow. His other grafts were patent. He had evaluation with a Myoview study and Echo in 4/13. Myoview showed basal inferior scar, no ischemia and EF 64%. Echo was unremarkable.   On follow up he reports he is doing well. He is very sedentary.  He denies any chest pain or SOB.  He denies any palpitations or dizziness. He is no longer on any lipid lowering therapy due to statin intolerance and Zetia/Welchol stopped due to weakness. No Covid symptoms.   Current Outpatient Medications on File Prior to Visit  Medication Sig Dispense Refill  . alendronate (FOSAMAX) 70 MG tablet     . amLODipine (NORVASC) 10 MG tablet     . amLODipine (NORVASC) 5 MG tablet Take 5 mg by mouth daily.      . Cholecalciferol (VITAMIN D3) 1000 UNITS CAPS Take 1,000 Units by mouth daily.     . Coenzyme Q10 (CO Q-10) 100 MG CAPS Take 100 mg by mouth daily.     . fish oil-omega-3 fatty acids 1000 MG capsule Take 2 g by mouth 3 (three) times daily.      . Melatonin 3 MG CAPS Take 3 mg by mouth at bedtime as needed (for sleep).     . metFORMIN (GLUCOPHAGE) 500 MG tablet Take 500 mg by mouth 2 (two) times daily with a meal.     . Multiple Vitamins-Minerals (VISION FORMULA) TABS Take 1 tablet by mouth daily.      Marland Kitchen omeprazole (PRILOSEC) 20  MG capsule Take 20 mg by mouth daily as needed (for acid reflux or heartburn).     Marland Kitchen oxybutynin (DITROPAN) 5 MG tablet     . polyethylene glycol powder (MIRALAX) powder Take 17 g by mouth daily as needed for mild constipation.     . Tamsulosin HCl (FLOMAX) 0.4 MG CAPS Take 0.4 mg by mouth daily.      . traZODone (DESYREL) 50 MG tablet Take 25-50 mg by mouth at bedtime.      No current facility-administered medications on file prior to visit.     Allergies  Allergen Reactions  . Statins Other (See Comments)    myalgias    Past Medical History:  Diagnosis Date  . Arthritis   . BPH (benign prostatic hyperplasia)   . CAD (coronary artery disease)   . Diabetes mellitus   . Diverticulosis   . ED (erectile dysfunction)   . Esophageal stricture   . Gastric ulcer   . GERD (gastroesophageal reflux disease)   . Hiatal hernia   . Hyperlipidemia   . Hypertension   . Hypogonadism male   . Internal hemorrhoids   . Iron deficiency anemia   . Iron deficiency anemia 07/25/2014  . MGUS (monoclonal gammopathy of unknown significance) 07/25/2014  . Pneumonia     Past Surgical History:  Procedure  Laterality Date  . APPENDECTOMY    . BASAL CELL CARCINOMA EXCISION    . CORONARY ARTERY BYPASS GRAFT  1995   LIMA-LAD, svg-OM1-2, svg-rca  . INGUINAL HERNIA REPAIR     Bilateral  . TONSILLECTOMY      Social History   Tobacco Use  Smoking Status Former Smoker  . Quit date: 10/06/1993  . Years since quitting: 25.6  Smokeless Tobacco Never Used    Social History   Substance and Sexual Activity  Alcohol Use No    Family History  Problem Relation Age of Onset  . Heart attack Father        Died at age 50.  . Lung cancer Mother   . Diabetes Sister   . Cancer Paternal Grandmother   . Colon cancer Neg Hx   . Rectal cancer Neg Hx   . Stomach cancer Neg Hx   . Liver cancer Neg Hx   . Esophageal cancer Neg Hx     Review of Systems: As noted in HPI. All other systems were reviewed  and are negative.  Physical Exam: BP 140/62   Pulse 82   Temp (!) 97.3 F (36.3 C)   Ht 5\' 7"  (1.702 m)   Wt 132 lb 14.4 oz (60.3 kg)   SpO2 92%   BMI 20.82 kg/m  GENERAL:  Well appearing, elderly WM in NAD HEENT:  PERRL, EOMI, sclera are clear. Oropharynx is clear. NECK:  No jugular venous distention, carotid upstroke brisk and symmetric, no bruits, no thyromegaly or adenopathy LUNGS:  Clear to auscultation bilaterally CHEST:  Unremarkable HEART:  RRR,  PMI not displaced or sustained,S1 and S2 within normal limits, no S3, no S4: no clicks, no rubs, no murmurs ABD:  Soft, nontender. BS +, no masses or bruits. No hepatomegaly, no splenomegaly EXT:  2 + pulses throughout, no edema, no cyanosis no clubbing SKIN:  Warm and dry.  No rashes NEURO:  Alert and oriented x 3. Cranial nerves II through XII intact. PSYCH:  Cognitively intact    LABORATORY DATA: Lab Results  Component Value Date   WBC 6.6 07/05/2018   HGB 11.5 (L) 07/05/2018   HCT 35.2 (L) 07/05/2018   PLT 289 07/05/2018   GLUCOSE 145 (H) 07/05/2018   ALT 7 07/05/2018   AST 9 (L) 07/05/2018   NA 140 07/05/2018   K 4.1 07/05/2018   CL 98 07/05/2018   CREATININE 1.05 07/05/2018   BUN 20 07/05/2018   CO2 35 (H) 07/05/2018   TSH 1.790 01/18/2018   INR 1.1 08/23/2008   Labs dated 04/07/16: cholesterol 194, triglycerides 86, HDL 56, LDL 121. CMET and TSH normal Dated 10/09/16: A1c 6.6%. Dated 04/13/17: cholesterol 198, triglycerides 97, HDL 57, LDL 122.  Dated 11/17/17: A1c 7.2%.  Dated 10/19/17: chemistries and CBC normal.  Dated 06/08/18: cholesterol 170, triglycerides 87, HDL 49, LDL 104.  Dated 12/14/18: A1c 6.8%  ECG today demonstrates normal sinus rhythm with PVCs and a first degree AV block.  RBBB.. Rate 82.   I have personally reviewed and interpreted this study.    Assessment / Plan: 1. CAD s/p remote CABG in 1995. Known occlusion of SVG to RCA in 2005. Last myoview in 2013 showed inferior wall scar without  ischemia. EF normal. He is asymptomatic. Continue medical Rx.  At his age should manage conservatively. I recommend he resume taking ASA 81 mg daily.  2. Hyperlipidemia. Intolerant to statins/ Welchol/Zetia.  3. DM improved control with dietary modification and weight  loss. A1c 6.8%  4. PVCs/PACs. Asymptomatic. Chronic.   Disposition. I will follow up in one year.

## 2019-05-30 ENCOUNTER — Other Ambulatory Visit: Payer: Self-pay

## 2019-05-30 ENCOUNTER — Ambulatory Visit (INDEPENDENT_AMBULATORY_CARE_PROVIDER_SITE_OTHER): Payer: Medicare Other | Admitting: Cardiology

## 2019-05-30 ENCOUNTER — Encounter: Payer: Self-pay | Admitting: Cardiology

## 2019-05-30 VITALS — BP 140/62 | HR 82 | Temp 97.3°F | Ht 67.0 in | Wt 132.9 lb

## 2019-05-30 DIAGNOSIS — E7800 Pure hypercholesterolemia, unspecified: Secondary | ICD-10-CM | POA: Diagnosis not present

## 2019-05-30 DIAGNOSIS — I493 Ventricular premature depolarization: Secondary | ICD-10-CM

## 2019-05-30 DIAGNOSIS — I25709 Atherosclerosis of coronary artery bypass graft(s), unspecified, with unspecified angina pectoris: Secondary | ICD-10-CM | POA: Diagnosis not present

## 2019-05-30 MED ORDER — ASPIRIN EC 81 MG PO TBEC: 81.0000 mg | DELAYED_RELEASE_TABLET | Freq: Every day | ORAL | 3 refills | Status: AC

## 2019-05-30 NOTE — Patient Instructions (Signed)
Start ASA 81mg daily

## 2019-07-11 ENCOUNTER — Other Ambulatory Visit: Payer: Self-pay

## 2019-07-11 ENCOUNTER — Inpatient Hospital Stay: Payer: Medicare Other | Attending: Hematology and Oncology

## 2019-07-11 DIAGNOSIS — D509 Iron deficiency anemia, unspecified: Secondary | ICD-10-CM | POA: Insufficient documentation

## 2019-07-11 DIAGNOSIS — Z79899 Other long term (current) drug therapy: Secondary | ICD-10-CM | POA: Insufficient documentation

## 2019-07-11 DIAGNOSIS — Z85820 Personal history of malignant melanoma of skin: Secondary | ICD-10-CM

## 2019-07-11 DIAGNOSIS — D472 Monoclonal gammopathy: Secondary | ICD-10-CM | POA: Diagnosis present

## 2019-07-11 DIAGNOSIS — D500 Iron deficiency anemia secondary to blood loss (chronic): Secondary | ICD-10-CM

## 2019-07-11 LAB — COMPREHENSIVE METABOLIC PANEL
ALT: <6 U/L (ref 0–44)
AST: 9 U/L — ABNORMAL LOW (ref 15–41)
Albumin: 3.3 g/dL — ABNORMAL LOW (ref 3.5–5.0)
Alkaline Phosphatase: 62 U/L (ref 38–126)
Anion gap: 10 (ref 5–15)
BUN: 24 mg/dL — ABNORMAL HIGH (ref 8–23)
CO2: 33 mmol/L — ABNORMAL HIGH (ref 22–32)
Calcium: 9.4 mg/dL (ref 8.9–10.3)
Chloride: 97 mmol/L — ABNORMAL LOW (ref 98–111)
Creatinine, Ser: 1.06 mg/dL (ref 0.61–1.24)
GFR calc Af Amer: >60 mL/min (ref >60)
GFR calc non Af Amer: >60 mL/min (ref >60)
Glucose, Bld: 180 mg/dL — ABNORMAL HIGH (ref 70–99)
Potassium: 4.4 mmol/L (ref 3.5–5.1)
Sodium: 140 mmol/L (ref 135–145)
Total Bilirubin: 0.6 mg/dL (ref 0.3–1.2)
Total Protein: 7.1 g/dL (ref 6.5–8.1)

## 2019-07-11 LAB — CBC WITH DIFFERENTIAL/PLATELET
Abs Immature Granulocytes: 0.02 10*3/uL (ref 0.00–0.07)
Basophils Absolute: 0.1 10*3/uL (ref 0.0–0.1)
Basophils Relative: 1 %
Eosinophils Absolute: 0 10*3/uL (ref 0.0–0.5)
Eosinophils Relative: 0 %
HCT: 34.9 % — ABNORMAL LOW (ref 39.0–52.0)
Hemoglobin: 11 g/dL — ABNORMAL LOW (ref 13.0–17.0)
Immature Granulocytes: 0 %
Lymphocytes Relative: 20 %
Lymphs Abs: 1.1 10*3/uL (ref 0.7–4.0)
MCH: 27.9 pg (ref 26.0–34.0)
MCHC: 31.5 g/dL (ref 30.0–36.0)
MCV: 88.6 fL (ref 80.0–100.0)
Monocytes Absolute: 0.6 10*3/uL (ref 0.1–1.0)
Monocytes Relative: 11 %
Neutro Abs: 3.9 10*3/uL (ref 1.7–7.7)
Neutrophils Relative %: 68 %
Platelets: 291 10*3/uL (ref 150–400)
RBC: 3.94 MIL/uL — ABNORMAL LOW (ref 4.22–5.81)
RDW: 14 % (ref 11.5–15.5)
WBC: 5.8 10*3/uL (ref 4.0–10.5)
nRBC: 0 % (ref 0.0–0.2)

## 2019-07-11 LAB — IRON AND TIBC
Iron: 42 ug/dL (ref 42–163)
Saturation Ratios: 16 % — ABNORMAL LOW (ref 20–55)
TIBC: 264 ug/dL (ref 202–409)
UIBC: 222 ug/dL (ref 117–376)

## 2019-07-11 LAB — LACTATE DEHYDROGENASE: LDH: 109 U/L (ref 98–192)

## 2019-07-11 LAB — FERRITIN: Ferritin: 66 ng/mL (ref 24–336)

## 2019-07-12 LAB — KAPPA/LAMBDA LIGHT CHAINS
Kappa free light chain: 22.6 mg/L — ABNORMAL HIGH (ref 3.3–19.4)
Kappa, lambda light chain ratio: 0.28 (ref 0.26–1.65)
Lambda free light chains: 81.4 mg/L — ABNORMAL HIGH (ref 5.7–26.3)

## 2019-07-13 LAB — MULTIPLE MYELOMA PANEL, SERUM
Albumin SerPl Elph-Mcnc: 3.4 g/dL (ref 2.9–4.4)
Albumin/Glob SerPl: 1.1 (ref 0.7–1.7)
Alpha 1: 0.2 g/dL (ref 0.0–0.4)
Alpha2 Glob SerPl Elph-Mcnc: 0.9 g/dL (ref 0.4–1.0)
B-Globulin SerPl Elph-Mcnc: 1.6 g/dL — ABNORMAL HIGH (ref 0.7–1.3)
Gamma Glob SerPl Elph-Mcnc: 0.6 g/dL (ref 0.4–1.8)
Globulin, Total: 3.3 g/dL (ref 2.2–3.9)
IgA: 1346 mg/dL — ABNORMAL HIGH (ref 61–437)
IgG (Immunoglobin G), Serum: 750 mg/dL (ref 603–1613)
IgM (Immunoglobulin M), Srm: 26 mg/dL (ref 15–143)
M Protein SerPl Elph-Mcnc: 0.9 g/dL — ABNORMAL HIGH
Total Protein ELP: 6.7 g/dL (ref 6.0–8.5)

## 2019-07-18 ENCOUNTER — Encounter: Payer: Self-pay | Admitting: Hematology and Oncology

## 2019-07-18 ENCOUNTER — Inpatient Hospital Stay (HOSPITAL_BASED_OUTPATIENT_CLINIC_OR_DEPARTMENT_OTHER): Payer: Medicare Other | Admitting: Hematology and Oncology

## 2019-07-18 ENCOUNTER — Other Ambulatory Visit: Payer: Self-pay

## 2019-07-18 DIAGNOSIS — D500 Iron deficiency anemia secondary to blood loss (chronic): Secondary | ICD-10-CM | POA: Diagnosis not present

## 2019-07-18 DIAGNOSIS — D472 Monoclonal gammopathy: Secondary | ICD-10-CM | POA: Diagnosis not present

## 2019-07-18 NOTE — Assessment & Plan Note (Signed)
Clinically, he has early stage disease. I discussed with him and his wife the natural history of MGUS. He has no evidence of organ damage. His last set of myeloma panel was stable I plan to recheck myeloma panel once a year

## 2019-07-18 NOTE — Progress Notes (Signed)
[facility] OFFICE PROGRESS NOTE  Patient Care Team: Shon Baton, MD as PCP - General (Internal Medicine)  ASSESSMENT & PLAN:  MGUS (monoclonal gammopathy of unknown significance) Clinically, he has early stage disease. I discussed with him and his wife the natural history of MGUS. He has no evidence of organ damage. His last set of myeloma panel was stable I plan to recheck myeloma panel once a year  Iron deficiency anemia He has multifactorial anemia, with component of mild iron deficiency We discussed dietary change. His recent iron studies were adequate   Orders Placed This Encounter  Procedures  . Comprehensive metabolic panel    Standing Status:   Future    Standing Expiration Date:   08/21/2020  . CBC with Differential/Platelet    Standing Status:   Future    Standing Expiration Date:   08/21/2020  . Kappa/lambda light chains    Standing Status:   Future    Standing Expiration Date:   08/21/2020  . Multiple Myeloma Panel (SPEP&IFE w/QIG)    Standing Status:   Future    Standing Expiration Date:   08/21/2020  . Ferritin    Standing Status:   Future    Standing Expiration Date:   07/17/2020    INTERVAL HISTORY: Please see below for problem oriented charting. He returns for further follow-up for MGUS He is doing well No recent bone pain Denies recent infection, fever or chills He denies recent bleeding I also collaborated the history with his wife  SUMMARY OF ONCOLOGIC HISTORY:  The patient was referred here because of abnormal serum protein electrophoresis. This patient was noted to have abnormal total protein level and subsequent additional workup showed IgA lambda MGUS. He was being observed He was anemic in the past and had been taking iron supplement for long time. He had EGD and colonoscopy in the past, the last one in 2012 showed erosive gastritis He received intravenous iron in 2015 REVIEW OF SYSTEMS:   Constitutional: Denies fevers,  chills or abnormal weight loss Eyes: Denies blurriness of vision Ears, nose, mouth, throat, and face: Denies mucositis or sore throat Respiratory: Denies cough, dyspnea or wheezes Cardiovascular: Denies palpitation, chest discomfort or lower extremity swelling Gastrointestinal:  Denies nausea, heartburn or change in bowel habits Skin: Denies abnormal skin rashes Lymphatics: Denies new lymphadenopathy or easy bruising Neurological:Denies numbness, tingling or new weaknesses Behavioral/Psych: Mood is stable, no new changes  All other systems were reviewed with the patient and are negative.  I have reviewed the past medical history, past surgical history, social history and family history with the patient and they are unchanged from previous note.  ALLERGIES:  is allergic to statins.  MEDICATIONS:  Current Outpatient Medications  Medication Sig Dispense Refill  . alendronate (FOSAMAX) 70 MG tablet     . amLODipine (NORVASC) 10 MG tablet     . amLODipine (NORVASC) 5 MG tablet Take 5 mg by mouth daily.      Marland Kitchen aspirin EC 81 MG tablet Take 1 tablet (81 mg total) by mouth daily. 90 tablet 3  . Cholecalciferol (VITAMIN D3) 1000 UNITS CAPS Take 1,000 Units by mouth daily.     . Coenzyme Q10 (CO Q-10) 100 MG CAPS Take 100 mg by mouth daily.     . fish oil-omega-3 fatty acids 1000 MG capsule Take 2 g by mouth 3 (three) times daily.      . Melatonin 3 MG CAPS Take 3 mg by mouth at bedtime as needed (  for sleep).     . metFORMIN (GLUCOPHAGE) 500 MG tablet Take 500 mg by mouth 2 (two) times daily with a meal.     . Multiple Vitamins-Minerals (VISION FORMULA) TABS Take 1 tablet by mouth daily.      Marland Kitchen MYRBETRIQ 50 MG TB24 tablet     . omeprazole (PRILOSEC) 20 MG capsule Take 20 mg by mouth daily as needed (for acid reflux or heartburn).     Marland Kitchen oxybutynin (DITROPAN) 5 MG tablet     . polyethylene glycol powder (MIRALAX) powder Take 17 g by mouth daily as needed for mild constipation.     . Tamsulosin  HCl (FLOMAX) 0.4 MG CAPS Take 0.4 mg by mouth daily.      . traZODone (DESYREL) 50 MG tablet Take 25-50 mg by mouth at bedtime.      No current facility-administered medications for this visit.     PHYSICAL EXAMINATION: ECOG PERFORMANCE STATUS: 1 - Symptomatic but completely ambulatory  Vitals:   07/18/19 1113  BP: (!) 166/67  Pulse: 70  Resp: 18  Temp: 98.7 F (37.1 C)  SpO2: 96%   Filed Weights   07/18/19 1113  Weight: 137 lb 9.6 oz (62.4 kg)    GENERAL:alert, no distress and comfortable SKIN: skin color, texture, turgor are normal, no rashes or significant lesions EYES: normal, Conjunctiva are pink and non-injected, sclera clear OROPHARYNX:no exudate, no erythema and lips, buccal mucosa, and tongue normal  NECK: supple, thyroid normal size, non-tender, without nodularity LYMPH:  no palpable lymphadenopathy in the cervical, axillary or inguinal LUNGS: clear to auscultation and percussion with normal breathing effort HEART: regular rate & rhythm and no murmurs and no lower extremity edema ABDOMEN:abdomen soft, non-tender and normal bowel sounds Musculoskeletal:no cyanosis of digits and no clubbing  NEURO: alert & oriented x 3 with fluent speech, no focal motor/sensory deficits  LABORATORY DATA:  I have reviewed the data as listed    Component Value Date/Time   NA 140 07/11/2019 1141   NA 141 07/06/2017 0843   K 4.4 07/11/2019 1141   K 3.9 07/06/2017 0843   CL 97 (L) 07/11/2019 1141   CO2 33 (H) 07/11/2019 1141   CO2 32 (H) 07/06/2017 0843   GLUCOSE 180 (H) 07/11/2019 1141   GLUCOSE 128 07/06/2017 0843   BUN 24 (H) 07/11/2019 1141   BUN 25.8 07/06/2017 0843   CREATININE 1.06 07/11/2019 1141   CREATININE 1.0 07/06/2017 0843   CALCIUM 9.4 07/11/2019 1141   CALCIUM 9.5 07/06/2017 0843   PROT 7.1 07/11/2019 1141   PROT 7.0 07/06/2017 0843   PROT 6.7 07/06/2017 0843   ALBUMIN 3.3 (L) 07/11/2019 1141   ALBUMIN 3.9 07/06/2017 0843   AST 9 (L) 07/11/2019 1141   AST  12 07/06/2017 0843   ALT <6 07/11/2019 1141   ALT 8 07/06/2017 0843   ALKPHOS 62 07/11/2019 1141   ALKPHOS 46 07/06/2017 0843   BILITOT 0.6 07/11/2019 1141   BILITOT 1.08 07/06/2017 0843   GFRNONAA >60 07/11/2019 1141   GFRAA >60 07/11/2019 1141    No results found for: SPEP, UPEP  Lab Results  Component Value Date   WBC 5.8 07/11/2019   NEUTROABS 3.9 07/11/2019   HGB 11.0 (L) 07/11/2019   HCT 34.9 (L) 07/11/2019   MCV 88.6 07/11/2019   PLT 291 07/11/2019      Chemistry      Component Value Date/Time   NA 140 07/11/2019 1141   NA 141 07/06/2017 0843  K 4.4 07/11/2019 1141   K 3.9 07/06/2017 0843   CL 97 (L) 07/11/2019 1141   CO2 33 (H) 07/11/2019 1141   CO2 32 (H) 07/06/2017 0843   BUN 24 (H) 07/11/2019 1141   BUN 25.8 07/06/2017 0843   CREATININE 1.06 07/11/2019 1141   CREATININE 1.0 07/06/2017 0843      Component Value Date/Time   CALCIUM 9.4 07/11/2019 1141   CALCIUM 9.5 07/06/2017 0843   ALKPHOS 62 07/11/2019 1141   ALKPHOS 46 07/06/2017 0843   AST 9 (L) 07/11/2019 1141   AST 12 07/06/2017 0843   ALT <6 07/11/2019 1141   ALT 8 07/06/2017 0843   BILITOT 0.6 07/11/2019 1141   BILITOT 1.08 07/06/2017 0843       All questions were answered. The patient knows to call the clinic with any problems, questions or concerns. No barriers to learning was detected.  I spent 10 minutes counseling the patient face to face. The total time spent in the appointment was 15 minutes and more than 50% was on counseling and review of test results  Heath Lark, MD 07/18/2019 12:52 PM

## 2019-07-18 NOTE — Assessment & Plan Note (Signed)
He has multifactorial anemia, with component of mild iron deficiency We discussed dietary change. His recent iron studies were adequate

## 2019-07-19 ENCOUNTER — Telehealth: Payer: Self-pay | Admitting: Hematology and Oncology

## 2019-07-19 NOTE — Telephone Encounter (Signed)
I talk with patient regarding schedule  

## 2019-09-05 ENCOUNTER — Other Ambulatory Visit: Payer: Self-pay

## 2019-09-05 ENCOUNTER — Emergency Department (HOSPITAL_COMMUNITY): Payer: Medicare Other

## 2019-09-05 ENCOUNTER — Encounter (HOSPITAL_COMMUNITY): Payer: Self-pay | Admitting: Emergency Medicine

## 2019-09-05 ENCOUNTER — Inpatient Hospital Stay (HOSPITAL_COMMUNITY)
Admission: EM | Admit: 2019-09-05 | Discharge: 2019-09-12 | DRG: 193 | Disposition: A | Discharge status: Home / Self Care | Payer: Medicare Other | Attending: Internal Medicine | Admitting: Internal Medicine

## 2019-09-05 DIAGNOSIS — L8990 Pressure ulcer of unspecified site, unspecified stage: Secondary | ICD-10-CM | POA: Insufficient documentation

## 2019-09-05 DIAGNOSIS — Z87891 Personal history of nicotine dependence: Secondary | ICD-10-CM

## 2019-09-05 DIAGNOSIS — E8770 Fluid overload, unspecified: Secondary | ICD-10-CM | POA: Diagnosis present

## 2019-09-05 DIAGNOSIS — E785 Hyperlipidemia, unspecified: Secondary | ICD-10-CM | POA: Diagnosis present

## 2019-09-05 DIAGNOSIS — J9601 Acute respiratory failure with hypoxia: Secondary | ICD-10-CM | POA: Diagnosis present

## 2019-09-05 DIAGNOSIS — L89152 Pressure ulcer of sacral region, stage 2: Secondary | ICD-10-CM | POA: Diagnosis present

## 2019-09-05 DIAGNOSIS — F0390 Unspecified dementia without behavioral disturbance: Secondary | ICD-10-CM | POA: Diagnosis present

## 2019-09-05 DIAGNOSIS — E43 Unspecified severe protein-calorie malnutrition: Secondary | ICD-10-CM | POA: Diagnosis present

## 2019-09-05 DIAGNOSIS — R0602 Shortness of breath: Secondary | ICD-10-CM | POA: Diagnosis not present

## 2019-09-05 DIAGNOSIS — N390 Urinary tract infection, site not specified: Secondary | ICD-10-CM | POA: Diagnosis present

## 2019-09-05 DIAGNOSIS — E119 Type 2 diabetes mellitus without complications: Secondary | ICD-10-CM | POA: Diagnosis present

## 2019-09-05 DIAGNOSIS — E440 Moderate protein-calorie malnutrition: Secondary | ICD-10-CM | POA: Diagnosis not present

## 2019-09-05 DIAGNOSIS — J189 Pneumonia, unspecified organism: Principal | ICD-10-CM | POA: Diagnosis present

## 2019-09-05 DIAGNOSIS — K219 Gastro-esophageal reflux disease without esophagitis: Secondary | ICD-10-CM | POA: Diagnosis present

## 2019-09-05 DIAGNOSIS — I10 Essential (primary) hypertension: Secondary | ICD-10-CM | POA: Diagnosis present

## 2019-09-05 DIAGNOSIS — R0902 Hypoxemia: Secondary | ICD-10-CM

## 2019-09-05 DIAGNOSIS — Z955 Presence of coronary angioplasty implant and graft: Secondary | ICD-10-CM

## 2019-09-05 DIAGNOSIS — E46 Unspecified protein-calorie malnutrition: Secondary | ICD-10-CM | POA: Diagnosis present

## 2019-09-05 DIAGNOSIS — Z20828 Contact with and (suspected) exposure to other viral communicable diseases: Secondary | ICD-10-CM | POA: Diagnosis present

## 2019-09-05 DIAGNOSIS — N400 Enlarged prostate without lower urinary tract symptoms: Secondary | ICD-10-CM | POA: Diagnosis present

## 2019-09-05 DIAGNOSIS — N401 Enlarged prostate with lower urinary tract symptoms: Secondary | ICD-10-CM | POA: Diagnosis not present

## 2019-09-05 DIAGNOSIS — E860 Dehydration: Secondary | ICD-10-CM | POA: Diagnosis present

## 2019-09-05 DIAGNOSIS — Z951 Presence of aortocoronary bypass graft: Secondary | ICD-10-CM

## 2019-09-05 DIAGNOSIS — I2510 Atherosclerotic heart disease of native coronary artery without angina pectoris: Secondary | ICD-10-CM | POA: Diagnosis present

## 2019-09-05 DIAGNOSIS — E876 Hypokalemia: Secondary | ICD-10-CM | POA: Diagnosis present

## 2019-09-05 DIAGNOSIS — D649 Anemia, unspecified: Secondary | ICD-10-CM | POA: Diagnosis present

## 2019-09-05 DIAGNOSIS — R0600 Dyspnea, unspecified: Secondary | ICD-10-CM

## 2019-09-05 LAB — CBC WITH DIFFERENTIAL/PLATELET
Abs Immature Granulocytes: 0.05 10*3/uL (ref 0.00–0.07)
Basophils Absolute: 0 10*3/uL (ref 0.0–0.1)
Basophils Relative: 0 %
Eosinophils Absolute: 0 10*3/uL (ref 0.0–0.5)
Eosinophils Relative: 0 %
HCT: 38 % — ABNORMAL LOW (ref 39.0–52.0)
Hemoglobin: 11.9 g/dL — ABNORMAL LOW (ref 13.0–17.0)
Immature Granulocytes: 1 %
Lymphocytes Relative: 6 %
Lymphs Abs: 0.6 10*3/uL — ABNORMAL LOW (ref 0.7–4.0)
MCH: 28.1 pg (ref 26.0–34.0)
MCHC: 31.3 g/dL (ref 30.0–36.0)
MCV: 89.8 fL (ref 80.0–100.0)
Monocytes Absolute: 1 10*3/uL (ref 0.1–1.0)
Monocytes Relative: 9 %
Neutro Abs: 9.3 10*3/uL — ABNORMAL HIGH (ref 1.7–7.7)
Neutrophils Relative %: 84 %
Platelets: 395 10*3/uL (ref 150–400)
RBC: 4.23 MIL/uL (ref 4.22–5.81)
RDW: 15.4 % (ref 11.5–15.5)
WBC: 11 10*3/uL — ABNORMAL HIGH (ref 4.0–10.5)
nRBC: 0 % (ref 0.0–0.2)

## 2019-09-05 LAB — CBG MONITORING, ED
Glucose-Capillary: 106 mg/dL — ABNORMAL HIGH (ref 70–99)
Glucose-Capillary: 123 mg/dL — ABNORMAL HIGH (ref 70–99)

## 2019-09-05 LAB — LACTIC ACID, PLASMA: Lactic Acid, Venous: 1.7 mmol/L (ref 0.5–1.9)

## 2019-09-05 LAB — POC SARS CORONAVIRUS 2 AG -  ED: SARS Coronavirus 2 Ag: NEGATIVE

## 2019-09-05 LAB — TROPONIN I (HIGH SENSITIVITY): Troponin I (High Sensitivity): 22 ng/L — ABNORMAL HIGH (ref <18)

## 2019-09-05 LAB — COMPREHENSIVE METABOLIC PANEL
ALT: 10 U/L (ref 0–44)
AST: 11 U/L — ABNORMAL LOW (ref 15–41)
Albumin: 2.9 g/dL — ABNORMAL LOW (ref 3.5–5.0)
Alkaline Phosphatase: 51 U/L (ref 38–126)
Anion gap: 12 (ref 5–15)
BUN: 34 mg/dL — ABNORMAL HIGH (ref 8–23)
CO2: 33 mmol/L — ABNORMAL HIGH (ref 22–32)
Calcium: 9.4 mg/dL (ref 8.9–10.3)
Chloride: 95 mmol/L — ABNORMAL LOW (ref 98–111)
Creatinine, Ser: 1.1 mg/dL (ref 0.61–1.24)
GFR calc Af Amer: >60 mL/min (ref >60)
GFR calc non Af Amer: >60 mL/min (ref >60)
Glucose, Bld: 168 mg/dL — ABNORMAL HIGH (ref 70–99)
Potassium: 3.4 mmol/L — ABNORMAL LOW (ref 3.5–5.1)
Sodium: 140 mmol/L (ref 135–145)
Total Bilirubin: 0.7 mg/dL (ref 0.3–1.2)
Total Protein: 7.6 g/dL (ref 6.5–8.1)

## 2019-09-05 LAB — SARS CORONAVIRUS 2 (TAT 6-24 HRS): SARS Coronavirus 2: NEGATIVE

## 2019-09-05 LAB — PROCALCITONIN: Procalcitonin: <0.1 ng/mL

## 2019-09-05 LAB — HEMOGLOBIN A1C
Hgb A1c MFr Bld: 7.3 % — ABNORMAL HIGH (ref 4.8–5.6)
Mean Plasma Glucose: 162.81 mg/dL

## 2019-09-05 MED ORDER — MIRABEGRON ER 50 MG PO TB24
50.0000 mg | ORAL_TABLET | Freq: Every day | ORAL | Status: DC
  Administered 2019-09-06 – 2019-09-07 (×2): 50 mg via ORAL
  Filled 2019-09-05 (×3): qty 1

## 2019-09-05 MED ORDER — INSULIN ASPART 100 UNIT/ML ~~LOC~~ SOLN
0.0000 [IU] | Freq: Every day | SUBCUTANEOUS | Status: DC
  Administered 2019-09-07: 2 [IU] via SUBCUTANEOUS
  Administered 2019-09-09 – 2019-09-11 (×3): 3 [IU] via SUBCUTANEOUS

## 2019-09-05 MED ORDER — SODIUM CHLORIDE 0.9 % IV SOLN
500.0000 mg | INTRAVENOUS | Status: DC
  Administered 2019-09-06 – 2019-09-07 (×2): 500 mg via INTRAVENOUS
  Filled 2019-09-05 (×4): qty 500

## 2019-09-05 MED ORDER — ENOXAPARIN SODIUM 40 MG/0.4ML ~~LOC~~ SOLN
40.0000 mg | SUBCUTANEOUS | Status: DC
  Administered 2019-09-06 – 2019-09-11 (×7): 40 mg via SUBCUTANEOUS
  Filled 2019-09-05 (×7): qty 0.4

## 2019-09-05 MED ORDER — TAMSULOSIN HCL 0.4 MG PO CAPS
0.4000 mg | ORAL_CAPSULE | Freq: Every day | ORAL | Status: DC
  Administered 2019-09-06 – 2019-09-12 (×7): 0.4 mg via ORAL
  Filled 2019-09-05 (×8): qty 1

## 2019-09-05 MED ORDER — SODIUM CHLORIDE 0.9 % IV SOLN
INTRAVENOUS | Status: DC
  Administered 2019-09-05 – 2019-09-07 (×5): via INTRAVENOUS

## 2019-09-05 MED ORDER — SODIUM CHLORIDE 0.9 % IV SOLN
500.0000 mg | Freq: Once | INTRAVENOUS | Status: AC
  Administered 2019-09-05: 500 mg via INTRAVENOUS
  Filled 2019-09-05: qty 500

## 2019-09-05 MED ORDER — MEMANTINE HCL 10 MG PO TABS
10.0000 mg | ORAL_TABLET | Freq: Every day | ORAL | Status: DC
  Administered 2019-09-06 – 2019-09-11 (×6): 10 mg via ORAL
  Filled 2019-09-05 (×8): qty 1

## 2019-09-05 MED ORDER — POTASSIUM CHLORIDE CRYS ER 20 MEQ PO TBCR
40.0000 meq | EXTENDED_RELEASE_TABLET | Freq: Once | ORAL | Status: AC
  Administered 2019-09-05: 40 meq via ORAL
  Filled 2019-09-05: qty 2

## 2019-09-05 MED ORDER — SODIUM CHLORIDE 0.9% FLUSH
3.0000 mL | Freq: Once | INTRAVENOUS | Status: AC
  Administered 2019-09-05: 3 mL via INTRAVENOUS

## 2019-09-05 MED ORDER — AMLODIPINE BESYLATE 10 MG PO TABS
10.0000 mg | ORAL_TABLET | Freq: Every day | ORAL | Status: DC
  Administered 2019-09-06 – 2019-09-12 (×7): 10 mg via ORAL
  Filled 2019-09-05 (×6): qty 1
  Filled 2019-09-05: qty 2

## 2019-09-05 MED ORDER — INSULIN ASPART 100 UNIT/ML ~~LOC~~ SOLN
0.0000 [IU] | Freq: Three times a day (TID) | SUBCUTANEOUS | Status: DC
  Administered 2019-09-06 – 2019-09-07 (×2): 3 [IU] via SUBCUTANEOUS
  Administered 2019-09-07: 8 [IU] via SUBCUTANEOUS
  Administered 2019-09-07: 5 [IU] via SUBCUTANEOUS
  Administered 2019-09-08: 11 [IU] via SUBCUTANEOUS
  Administered 2019-09-08: 3 [IU] via SUBCUTANEOUS
  Administered 2019-09-08: 11 [IU] via SUBCUTANEOUS
  Administered 2019-09-09: 2 [IU] via SUBCUTANEOUS
  Administered 2019-09-09 (×2): 3 [IU] via SUBCUTANEOUS
  Administered 2019-09-10: 5 [IU] via SUBCUTANEOUS
  Administered 2019-09-10: 2 [IU] via SUBCUTANEOUS
  Administered 2019-09-10: 8 [IU] via SUBCUTANEOUS
  Administered 2019-09-11: 3 [IU] via SUBCUTANEOUS
  Administered 2019-09-11: 2 [IU] via SUBCUTANEOUS
  Administered 2019-09-11: 8 [IU] via SUBCUTANEOUS
  Administered 2019-09-12: 2 [IU] via SUBCUTANEOUS

## 2019-09-05 MED ORDER — ALBUTEROL SULFATE (2.5 MG/3ML) 0.083% IN NEBU: 2.5000 mg | INHALATION_SOLUTION | RESPIRATORY_TRACT | Status: DC | PRN

## 2019-09-05 MED ORDER — SODIUM CHLORIDE 0.9 % IV SOLN
1.0000 g | INTRAVENOUS | Status: DC
  Administered 2019-09-06 – 2019-09-10 (×5): 1 g via INTRAVENOUS
  Filled 2019-09-05 (×2): qty 10
  Filled 2019-09-05: qty 1
  Filled 2019-09-05 (×2): qty 10
  Filled 2019-09-05: qty 1

## 2019-09-05 MED ORDER — SODIUM CHLORIDE 0.9 % IV SOLN
1.0000 g | Freq: Once | INTRAVENOUS | Status: AC
  Administered 2019-09-05: 1 g via INTRAVENOUS
  Filled 2019-09-05: qty 10

## 2019-09-05 MED ORDER — ASPIRIN EC 81 MG PO TBEC
81.0000 mg | DELAYED_RELEASE_TABLET | Freq: Every day | ORAL | Status: DC
  Administered 2019-09-05 – 2019-09-12 (×8): 81 mg via ORAL
  Filled 2019-09-05 (×8): qty 1

## 2019-09-05 MED ORDER — ALENDRONATE SODIUM 70 MG PO TABS: 70.0000 mg | ORAL_TABLET | ORAL | Status: DC

## 2019-09-05 MED ORDER — PANTOPRAZOLE SODIUM 40 MG PO TBEC
40.0000 mg | DELAYED_RELEASE_TABLET | Freq: Every day | ORAL | Status: DC
  Administered 2019-09-05 – 2019-09-12 (×8): 40 mg via ORAL
  Filled 2019-09-05 (×8): qty 1

## 2019-09-05 MED ORDER — VITAMIN D 25 MCG (1000 UNIT) PO TABS
2000.0000 [IU] | ORAL_TABLET | Freq: Every day | ORAL | Status: DC
  Administered 2019-09-06 – 2019-09-12 (×7): 2000 [IU] via ORAL
  Filled 2019-09-05 (×7): qty 2

## 2019-09-05 NOTE — ED Provider Notes (Signed)
[facility] EMERGENCY DEPARTMENT Provider Note   CSN: [id number] Arrival date & time: 09/05/19  1253     History   Chief Complaint Chief Complaint  Patient presents with  . Shortness of Breath    HPI Gabriel Lewis is a 85 y.o. male with history of MGUS, CAD s/p stents, diabetes, remote history of tobacco use brought to the ER by family for evaluation of cough.  Onset 1 month ago, constant, gradually worsening.  Described as productive of sputum.  Associated with decreased appetite, unintentional weight loss, generalized weakness and malaise.  Wife has noticed some diarrhea for the last month. History is mostly obtained from wife at bedside who states patient has refused to come to the hospital for evaluation.  He was seen by his PCP 1 week ago who instructed him to come to the ER but patient refused.  Today patient's children had to pick him up in place him in the car to bring him to the ER.  Patient is awake and provides some history but is guarded.  States he feels "fine".  He denies any chest pain, shortness of breath, abdominal pain.  Patient does not use oxygen at home.  SPO2 86% at rest during triage.     HPI  Past Medical History:  Diagnosis Date  . Arthritis   . BPH (benign prostatic hyperplasia)   . CAD (coronary artery disease)   . Diabetes mellitus   . Diverticulosis   . ED (erectile dysfunction)   . Esophageal stricture   . Gastric ulcer   . GERD (gastroesophageal reflux disease)   . Hiatal hernia   . Hyperlipidemia   . Hypertension   . Hypogonadism male   . Internal hemorrhoids   . Iron deficiency anemia   . Iron deficiency anemia 07/25/2014  . MGUS (monoclonal gammopathy of unknown significance) 07/25/2014  . Pneumonia     Patient Active Problem List   Diagnosis Date Noted  . Weight loss 10/19/2017  . PVC (premature ventricular contraction) 01/05/2017  . Osteopenia 08/08/2014  . MGUS (monoclonal gammopathy of unknown significance)  07/25/2014  . Iron deficiency anemia 07/25/2014  . History of melanoma 07/25/2014  . Murmur 01/14/2012  . Diabetes mellitus   . CAD (coronary artery disease)   . Hyperlipidemia     Past Surgical History:  Procedure Laterality Date  . APPENDECTOMY    . BASAL CELL CARCINOMA EXCISION    . CORONARY ARTERY BYPASS GRAFT  1995   LIMA-LAD, svg-OM1-2, svg-rca  . INGUINAL HERNIA REPAIR     Bilateral  . TONSILLECTOMY          Home Medications    Prior to Admission medications   Medication Sig Start Date End Date Taking? Authorizing Provider  alendronate (FOSAMAX) 70 MG tablet  12/28/18   [provider]  amLODipine (NORVASC) 10 MG tablet  01/08/19   [provider]  amLODipine (NORVASC) 5 MG tablet Take 5 mg by mouth daily.      [provider]  aspirin EC 81 MG tablet Take 1 tablet (81 mg total) by mouth daily. 05/30/19   Martinique, Peter M, MD  Cholecalciferol (VITAMIN D3) 1000 UNITS CAPS Take 1,000 Units by mouth daily.     [provider]  Coenzyme Q10 (CO Q-10) 100 MG CAPS Take 100 mg by mouth daily.     [provider]  fish oil-omega-3 fatty acids 1000 MG capsule Take 2 g by mouth 3 (three) times daily.  [provider]  Melatonin 3 MG CAPS Take 3 mg by mouth at bedtime as needed (for sleep).     [provider]  metFORMIN (GLUCOPHAGE) 500 MG tablet Take 500 mg by mouth 2 (two) times daily with a meal.     [provider]  Multiple Vitamins-Minerals (VISION FORMULA) TABS Take 1 tablet by mouth daily.      [provider]  MYRBETRIQ 50 MG TB24 tablet  05/09/19   [provider]  omeprazole (PRILOSEC) 20 MG capsule Take 20 mg by mouth daily as needed (for acid reflux or heartburn).     [provider]  oxybutynin (DITROPAN) 5 MG tablet  01/05/19   [provider]  polyethylene glycol powder (MIRALAX) powder Take 17 g by mouth daily as needed for mild constipation.     [provider]  Tamsulosin HCl (FLOMAX) 0.4 MG CAPS Take 0.4 mg by mouth daily.      [provider]  traZODone (DESYREL) 50 MG tablet Take 25-50 mg by mouth at bedtime.     [provider]    Family History Family History  Problem Relation Age of Onset  . Heart attack Father        Died at age 68.  . Lung cancer Mother   . Diabetes Sister   . Cancer Paternal Grandmother   . Colon cancer Neg Hx   . Rectal cancer Neg Hx   . Stomach cancer Neg Hx   . Liver cancer Neg Hx   . Esophageal cancer Neg Hx     Social History Social History   Tobacco Use  . Smoking status: Former Smoker    Quit date: 10/06/1993    Years since quitting: 25.9  . Smokeless tobacco: Never Used  Substance Use Topics  . Alcohol use: No  . Drug use: No     Allergies   Statins   Review of Systems Review of Systems  Constitutional: Positive for appetite change and fatigue.  Respiratory: Positive for cough.   Neurological: Positive for weakness (generalized).  All other systems reviewed and are negative.    Physical Exam Updated Vital Signs BP (!) 143/63   Pulse 93   Temp 100.2 F (37.9 C) (Rectal)   Resp (!) 27   SpO2 97%   Physical Exam Vitals signs and nursing note reviewed.  Constitutional:      Appearance: He is well-developed.     Comments: Non toxic.  HENT:     Head: Normocephalic and atraumatic.     Nose: Nose normal.  Eyes:     Conjunctiva/sclera: Conjunctivae normal.  Neck:     Musculoskeletal: Normal range of motion.  Cardiovascular:     Rate and Rhythm: Normal rate and regular rhythm.     Heart sounds: Normal heart sounds.     Comments: 1+ radial and DP pulses bilaterally.  No lower extremity edema.  No calf tenderness. Pulmonary:     Effort: Pulmonary effort is normal.     Comments: Productive sounding cough throughout exam.  SPO2 86% on room air.  SPO2 greater than 94% on 3 L [place].  Patient is guarded and provides brief answers, no obvious increased  work of breathing noted on exam. Abdominal:     General: Bowel sounds are normal.     Palpations: Abdomen is soft.     Tenderness: There is no abdominal tenderness.     Comments: No G/R/R. No suprapubic or CVA tenderness. Negative Murphy's and  McBurney's  Musculoskeletal: Normal range of motion.  Skin:    General: Skin is warm and dry.     Capillary Refill: Capillary refill takes less than 2 seconds.  Neurological:     Mental Status: He is alert.  Psychiatric:        Behavior: Behavior normal.      ED Treatments / Results  Labs (all labs ordered are listed, but only abnormal results are displayed) Labs Reviewed  COMPREHENSIVE METABOLIC PANEL - Abnormal; Notable for the following components:      Result Value   Potassium 3.4 (*)    Chloride 95 (*)    CO2 33 (*)    Glucose, Bld 168 (*)    BUN 34 (*)    Albumin 2.9 (*)    AST 11 (*)    All other components within normal limits  CBC WITH DIFFERENTIAL/PLATELET - Abnormal; Notable for the following components:   WBC 11.0 (*)    Hemoglobin 11.9 (*)    HCT 38.0 (*)    Neutro Abs 9.3 (*)    Lymphs Abs 0.6 (*)    All other components within normal limits  SARS CORONAVIRUS 2 (TAT 6-24 HRS)  LACTIC ACID, PLASMA  URINALYSIS, ROUTINE W REFLEX MICROSCOPIC  LACTIC ACID, PLASMA  POC SARS CORONAVIRUS 2 AG -  ED    EKG None  Radiology Dg Chest 2 View  Result Date: 09/05/2019 CLINICAL DATA:  Shortness of breath EXAM: CHEST - 2 VIEW COMPARISON:  08/23/2008 FINDINGS: Prior CABG changes. Heart size appears mildly enlarged although is largely obscured. Patchy right lower lobe airspace opacity. Left lung appears clear. No pleural effusion or pneumothorax. Exaggerated thoracic kyphosis. IMPRESSION: 1. Patchy airspace opacity at the right lung base is suspicious for pneumonia. 2. Mild cardiomegaly. Electronically Signed   By: Davina Poke M.D.   On: 09/05/2019 14:15    Procedures .Critical Care Performed by: Kinnie Feil,  PA-C Authorized by: Kinnie Feil, PA-C   Critical care provider statement:    Critical care time (minutes):  45   Critical care was necessary to treat or prevent imminent or life-threatening deterioration of the following conditions:  Respiratory failure   Critical care was time spent personally by me on the following activities:  Discussions with consultants, evaluation of patient's response to treatment, examination of patient, ordering and performing treatments and interventions, ordering and review of laboratory studies, ordering and review of radiographic studies, pulse oximetry, re-evaluation of patient's condition, obtaining history from patient or surrogate, review of old charts and development of treatment plan with patient or surrogate   I assumed direction of critical care for this patient from another provider in my specialty: no     (including critical care time)  Medications Ordered in ED Medications  azithromycin (ZITHROMAX) 500 mg in sodium chloride 0.9 % 250 mL IVPB (500 mg Intravenous New Bag/Given 09/05/19 1540)  sodium chloride flush (NS) 0.9 % injection 3 mL (3 mLs Intravenous Given 09/05/19 1450)  cefTRIAXone (ROCEPHIN) 1 g in sodium chloride 0.9 % 100 mL IVPB ( Intravenous Stopped 09/05/19 1528)     Initial Impression / Assessment and Plan / ED Course  I have reviewed the triage vital signs and the nursing notes.  Pertinent labs & imaging results that were available during my care of the patient were reviewed by me and considered in my medical decision making (see chart for details).  Clinical Course as of Sep 04 1549  Mon Sep 05, 2019  1532  Hemoglobin(!): 11.9 [CG]  1533 WBC(!): 11.0 [CG]  1533 NEUT#(!): 9.3 [CG]  1533 Lymphocyte #(!): 0.6 [CG]  1533 Lactic Acid, Venous: 1.7 [CG]  1533 Patchy airspace opacity at the right lung base is suspicious for pneumonia. 2. Mild cardiomegaly.  DG Chest 2 View [CG]    Clinical Course User Index [CG] Kinnie Feil, PA-C   EMR reviewed.  ER work-up initiated in triage.    ER work-up reviewed and interpreted by me as above.    Chest x-ray confirms right lower lobe pneumonia.  This fits clinical picture.  No CP or signs of hypervolemia.  Doubt ACS or HF decompensation.  POC antigen Covid test is negative.  DDX includes community-acquired pneumonia leading to hypoxia.  No definitive SIRS criteria however patient has hypoxia and intermittent tachypnea with movements. WBC 11.0, lactic acid normal. HD stable  Ceftriaxone/azithromycin ordered.  I don't think large NS IVF boluses required at this time given reassuring BP, lactic acid and no other signs of EOD.  Pt re-evaluated without clinical/respiratory decline, stable on [place].    Admitted for CAP, new oxygen requirement. Discussed with EDP. Final Clinical Impressions(s) / ED Diagnoses   Final diagnoses:  Hypoxia    ED Discharge Orders    None       Kinnie Feil, PA-C 09/05/19 1550    Dorie Rank, MD 09/06/19 [phone]

## 2019-09-05 NOTE — H&P (Signed)
History and Physical    Gabriel Lewis [id number] DOB: 1932/09/16 DOA: 09/05/2019  PCP: Shon Baton, MD  Patient coming from: Home  I have personally briefly reviewed patient's old medical records in [place]  Chief Complaint: Productive cough and shortness of breath  HPI: Gabriel Lewis is a 85 y.o. male with medical history significant of coronary artery disease status post CABG in 1995, diabetes, MGUS, hypertension, BPH, GERD, hyperlipidemia, anemia presents to emergency department with productive cough and shortness of breath since 1 month.  Patient's wife at bedside is the historian who reports that patient has productive cough with greenish sputum and shortness of breath since 1 month associated with generalized weakness, malaise, decreased appetite and unintentional weight loss.  Wife also mentioned about loose stools since 1 month, nonbloody, not associated with nausea or vomiting, crampy abdominal pain, COVID-19 exposure.  Wife tells me that patient's diet is mostly consist of candies and crackers.  Patient's wife recently diagnosed with pneumonia and has been taking her antibiotics as prescribed.  She tested negative for COVID-19.  No history of headache, blurry vision, fever, chills, chest pain, leg swelling, melena, hematemesis, urinary or sleep changes.  Patient lives with his wife, no history of smoking, alcohol, street drug use.  ED Course: Upon arrival: Patient was tachypneic, hypoxic with oxygen saturation of 86%,-he placed on 2 L of oxygen via nasal cannula, CBC shows WBC of 11.0, lactic acid: WNL, initial COVID-19 test came back negative, UA pending, CMP shows potassium of 3.4, BUN of 34 with normal creatinine and GFR, chest x-ray shows right lung base pneumonia and mild cardiomegaly.  Patient received IV azithromycin and Rocephin in the ED.   Review of Systems: As per HPI otherwise negative.    Past Medical History:  Diagnosis Date  . Arthritis   . BPH  (benign prostatic hyperplasia)   . CAD (coronary artery disease)   . Diabetes mellitus   . Diverticulosis   . ED (erectile dysfunction)   . Esophageal stricture   . Gastric ulcer   . GERD (gastroesophageal reflux disease)   . Hiatal hernia   . Hyperlipidemia   . Hypertension   . Hypogonadism male   . Internal hemorrhoids   . Iron deficiency anemia   . Iron deficiency anemia 07/25/2014  . MGUS (monoclonal gammopathy of unknown significance) 07/25/2014  . Pneumonia     Past Surgical History:  Procedure Laterality Date  . APPENDECTOMY    . BASAL CELL CARCINOMA EXCISION    . CORONARY ARTERY BYPASS GRAFT  1995   LIMA-LAD, svg-OM1-2, svg-rca  . INGUINAL HERNIA REPAIR     Bilateral  . TONSILLECTOMY       reports that he quit smoking about 25 years ago. He has never used smokeless tobacco. He reports that he does not drink alcohol or use drugs.  Allergies  Allergen Reactions  . Statins Other (See Comments)    myalgias    Family History  Problem Relation Age of Onset  . Heart attack Father        Died at age 6.  . Lung cancer Mother   . Diabetes Sister   . Cancer Paternal Grandmother   . Colon cancer Neg Hx   . Rectal cancer Neg Hx   . Stomach cancer Neg Hx   . Liver cancer Neg Hx   . Esophageal cancer Neg Hx     Prior to Admission medications   Medication Sig Start Date End Date Taking? Authorizing Provider  alendronate (FOSAMAX) 70 MG tablet  12/28/18   [provider]  amLODipine (NORVASC) 10 MG tablet  01/08/19   [provider]  amLODipine (NORVASC) 5 MG tablet Take 5 mg by mouth daily.      [provider]  aspirin EC 81 MG tablet Take 1 tablet (81 mg total) by mouth daily. 05/30/19   Martinique, Peter M, MD  Cholecalciferol (VITAMIN D3) 1000 UNITS CAPS Take 1,000 Units by mouth daily.     [provider]  Coenzyme Q10 (CO Q-10) 100 MG CAPS Take 100 mg by mouth daily.     [provider]  fish oil-omega-3 fatty acids 1000  MG capsule Take 2 g by mouth 3 (three) times daily.      [provider]  Melatonin 3 MG CAPS Take 3 mg by mouth at bedtime as needed (for sleep).     [provider]  metFORMIN (GLUCOPHAGE) 500 MG tablet Take 500 mg by mouth 2 (two) times daily with a meal.     [provider]  Multiple Vitamins-Minerals (VISION FORMULA) TABS Take 1 tablet by mouth daily.      [provider]  MYRBETRIQ 50 MG TB24 tablet  05/09/19   [provider]  omeprazole (PRILOSEC) 20 MG capsule Take 20 mg by mouth daily as needed (for acid reflux or heartburn).     [provider]  oxybutynin (DITROPAN) 5 MG tablet  01/05/19   [provider]  polyethylene glycol powder (MIRALAX) powder Take 17 g by mouth daily as needed for mild constipation.     [provider]  Tamsulosin HCl (FLOMAX) 0.4 MG CAPS Take 0.4 mg by mouth daily.      [provider]  traZODone (DESYREL) 50 MG tablet Take 25-50 mg by mouth at bedtime.     [provider]    Physical Exam: Vitals:   09/05/19 1500 09/05/19 1530 09/05/19 1542 09/05/19 1545  BP: 137/72 (!) 143/63    Pulse: 100 96  93  Resp: (!) 37 (!) 21  (!) 27  Temp:   100.2 F (37.9 C)   TempSrc:   Rectal   SpO2: 95% 97%  97%    Constitutional: NAD, calm, comfortable, on 2 L of oxygen via nasal cannula, patient appears to be very cachectic and dehydrated, has temporal wasting and sunken eyes.  He is sleepy but arousable and oriented to place and person. Eyes: PERRL, lids and conjunctivae normal ENMT: Mucous membranes are dry. Posterior pharynx clear of any exudate or lesions.Normal dentition.  Neck: normal, supple, no masses, no thyromegaly Respiratory: clear to auscultation bilaterally, no wheezing, no crackles. Normal respiratory effort. No accessory muscle use.  Cardiovascular: Regular rate and rhythm, no murmurs / rubs / gallops. No extremity edema. 2+ pedal pulses. No carotid bruits.  Abdomen:  no tenderness, no masses palpated. No hepatosplenomegaly. Bowel sounds positive.  Musculoskeletal: no clubbing / cyanosis. No joint deformity upper and lower extremities. Good ROM, no contractures. Normal muscle tone.  Skin: no rashes, lesions, ulcers. No induration Neurologic: CN 2-12 grossly intact. Sensation intact, DTR normal. Strength 5/5 in all 4.     Labs on Admission: I have personally reviewed following labs and imaging studies  CBC: Recent Labs  Lab 09/05/19 1338  WBC 11.0*  NEUTROABS 9.3*  HGB 11.9*  HCT 38.0*  MCV 89.8  PLT [id number]   Basic Metabolic Panel: Recent Labs  Lab 09/05/19 1338  NA 140  K 3.4*  CL  95*  CO2 33*  GLUCOSE 168*  BUN 34*  CREATININE 1.10  CALCIUM 9.4   GFR: CrCl cannot be calculated (Unknown ideal weight.). Liver Function Tests: Recent Labs  Lab 09/05/19 1338  AST 11*  ALT 10  ALKPHOS 51  BILITOT 0.7  PROT 7.6  ALBUMIN 2.9*   No results for input(s): LIPASE, AMYLASE in the last 168 hours. No results for input(s): AMMONIA in the last 168 hours. Coagulation Profile: No results for input(s): INR, PROTIME in the last 168 hours. Cardiac Enzymes: No results for input(s): CKTOTAL, CKMB, CKMBINDEX, TROPONINI in the last 168 hours. BNP (last 3 results) No results for input(s): PROBNP in the last 8760 hours. HbA1C: No results for input(s): HGBA1C in the last 72 hours. CBG: No results for input(s): GLUCAP in the last 168 hours. Lipid Profile: No results for input(s): CHOL, HDL, LDLCALC, TRIG, CHOLHDL, LDLDIRECT in the last 72 hours. Thyroid Function Tests: No results for input(s): TSH, T4TOTAL, FREET4, T3FREE, THYROIDAB in the last 72 hours. Anemia Panel: No results for input(s): VITAMINB12, FOLATE, FERRITIN, TIBC, IRON, RETICCTPCT in the last 72 hours. Urine analysis:    Component Value Date/Time   COLORURINE YELLOW 08/24/2008 0026   APPEARANCEUR CLEAR 08/24/2008 0026   LABSPEC 1.030 08/24/2008 0026   PHURINE 5.0 08/24/2008 0026    GLUCOSEU NEGATIVE 08/24/2008 0026   HGBUR TRACE (A) 08/24/2008 0026   BILIRUBINUR SMALL (A) 08/24/2008 0026   KETONESUR 15 (A) 08/24/2008 0026   PROTEINUR 30 (A) 08/24/2008 0026   UROBILINOGEN 1.0 08/24/2008 0026   NITRITE NEGATIVE 08/24/2008 0026   LEUKOCYTESUR NEGATIVE 08/24/2008 0026    Radiological Exams on Admission: Dg Chest 2 View  Result Date: 09/05/2019 CLINICAL DATA:  Shortness of breath EXAM: CHEST - 2 VIEW COMPARISON:  08/23/2008 FINDINGS: Prior CABG changes. Heart size appears mildly enlarged although is largely obscured. Patchy right lower lobe airspace opacity. Left lung appears clear. No pleural effusion or pneumothorax. Exaggerated thoracic kyphosis. IMPRESSION: 1. Patchy airspace opacity at the right lung base is suspicious for pneumonia. 2. Mild cardiomegaly. Electronically Signed   By: Davina Poke M.D.   On: 09/05/2019 14:15    EKG: Sinus tachycardia with first-degree AV block, right bundle branch block, no ST elevation or depression noted.  Assessment/Plan Principal Problem:   CAP (community acquired pneumonia) Active Problems:   Diabetes mellitus ([place])   Hyperlipidemia   GERD (gastroesophageal reflux disease)   BPH (benign prostatic hyperplasia)   Hypertension   Anemia   Hypokalemia   Protein calorie malnutrition (HCC)   Dehydration   Acute respiratory failure with hypoxia (HCC)   Acute respiratory failure with hypoxia: -Secondary to right lower lobe pneumonia.  Patient presented with productive cough, congestion, shortness of breath, generalized weakness and decreased appetite. -Chest x-ray as above, initial COVID-19 antigen test came back negative, repeat test is pending.  Patient is afebrile with leukocytosis of 11.0, lactic acid: WNL -We will admit patient on the floor for close monitoring. -He received IV Rocephin and azithromycin in the ED-we will continue same antibiotics -Check blood culture, procalcitonin level, urine Legionella antigen,  urine strep antigen, sputum culture -On continuous pulse ox-patient currently on 2 L of oxygen via nasal cannula.  We will try to wean off of oxygen.  On albuterol as needed for shortness of breath and wheezing. -Monitor vitals closely. -Consulted PT  Dehydration: -Secondary to decreased p.o. intake. -Patient's BUN elevated at 34, CO2 of 33, lactic acid: WNL, UA is pending -Patient appears to be very  dehydrated on exam -Start on IV fluids  Hypokalemia: -Likely secondary to decreased p.o. intake -Check magnesium level. -Replaced.  Repeat BMP tomorrow a.m.  GERD: Stable continue Protonix  BPH/urinary issues: Stable continue Flomax and Myrbetriq  Diabetes mellitus: Check A1c -He takes metformin at home-hold for now -Start patient on sliding scale insulin and monitor blood sugar closely.  Hypertension: Stable continue amlodipine -Monitor blood pressure closely  Protein calorie malnutrition: -Albumin: 2.9 -Consulted dietitian.  Coronary artery disease status post CABG in 1995: Stable -Continue aspirin  Dementia: Continue [place]  DVT prophylaxis: TED/SCD/Lovenox Code Status: Full code-confirmed with patient's wife Family Communication: Patient's wife present at bedside.  Plan of care discussed with patient and his wife in length and they verbalized understanding and agreed with it. Disposition Plan: TBD Consults called: None Admission status: Inpatient  Mckinley Jewel MD [facility] Pager [phone]  If 7PM-7AM, please contact night-coverage [email or web address] Password [email or web address]  09/05/2019, 4:02 PM

## 2019-09-05 NOTE — ED Triage Notes (Signed)
Pt's wife states he has been SOB, coughing up stuff for the month of Nov. Unable to walk without being out of breath. Pt refused to go to MD. Denies CP. Pt's sats 86% on room air. No known exposure to covid.

## 2019-09-05 NOTE — ED Notes (Signed)
Admitting at bedside 

## 2019-09-05 NOTE — ED Notes (Signed)
Ordered diet tray 

## 2019-09-06 ENCOUNTER — Other Ambulatory Visit: Payer: Self-pay

## 2019-09-06 LAB — URINALYSIS, ROUTINE W REFLEX MICROSCOPIC
Bilirubin Urine: NEGATIVE
Glucose, UA: NEGATIVE mg/dL
Ketones, ur: 5 mg/dL — AB
Nitrite: NEGATIVE
Protein, ur: 100 mg/dL — AB
Specific Gravity, Urine: 1.017 (ref 1.005–1.030)
WBC, UA: >50 WBC/hpf — ABNORMAL HIGH (ref 0–5)
pH: 6 (ref 5.0–8.0)

## 2019-09-06 LAB — CBG MONITORING, ED
Glucose-Capillary: 88 mg/dL (ref 70–99)
Glucose-Capillary: 122 mg/dL — ABNORMAL HIGH (ref 70–99)

## 2019-09-06 LAB — GLUCOSE, CAPILLARY
Glucose-Capillary: 151 mg/dL — ABNORMAL HIGH (ref 70–99)
Glucose-Capillary: 192 mg/dL — ABNORMAL HIGH (ref 70–99)

## 2019-09-06 LAB — CBC
HCT: 36.3 % — ABNORMAL LOW (ref 39.0–52.0)
Hemoglobin: 11.1 g/dL — ABNORMAL LOW (ref 13.0–17.0)
MCH: 28.2 pg (ref 26.0–34.0)
MCHC: 30.6 g/dL (ref 30.0–36.0)
MCV: 92.1 fL (ref 80.0–100.0)
Platelets: 344 10*3/uL (ref 150–400)
RBC: 3.94 MIL/uL — ABNORMAL LOW (ref 4.22–5.81)
RDW: 15.3 % (ref 11.5–15.5)
WBC: 10.5 10*3/uL (ref 4.0–10.5)
nRBC: 0 % (ref 0.0–0.2)

## 2019-09-06 LAB — BASIC METABOLIC PANEL
Anion gap: 10 (ref 5–15)
BUN: 25 mg/dL — ABNORMAL HIGH (ref 8–23)
CO2: 31 mmol/L (ref 22–32)
Calcium: 8.7 mg/dL — ABNORMAL LOW (ref 8.9–10.3)
Chloride: 101 mmol/L (ref 98–111)
Creatinine, Ser: 0.86 mg/dL (ref 0.61–1.24)
GFR calc Af Amer: >60 mL/min (ref >60)
GFR calc non Af Amer: >60 mL/min (ref >60)
Glucose, Bld: 103 mg/dL — ABNORMAL HIGH (ref 70–99)
Potassium: 3.4 mmol/L — ABNORMAL LOW (ref 3.5–5.1)
Sodium: 142 mmol/L (ref 135–145)

## 2019-09-06 LAB — HIV ANTIBODY (ROUTINE TESTING W REFLEX): HIV Screen 4th Generation wRfx: NONREACTIVE

## 2019-09-06 LAB — STREP PNEUMONIAE URINARY ANTIGEN: Strep Pneumo Urinary Antigen: NEGATIVE

## 2019-09-06 MED ORDER — LEVALBUTEROL HCL 1.25 MG/0.5ML IN NEBU
1.2500 mg | INHALATION_SOLUTION | Freq: Three times a day (TID) | RESPIRATORY_TRACT | Status: DC
  Administered 2019-09-06: 1.25 mg via RESPIRATORY_TRACT
  Filled 2019-09-06 (×2): qty 0.5

## 2019-09-06 MED ORDER — GUAIFENESIN-DM 100-10 MG/5ML PO SYRP
10.0000 mL | ORAL_SOLUTION | Freq: Four times a day (QID) | ORAL | Status: DC
  Administered 2019-09-06 – 2019-09-12 (×25): 10 mL via ORAL
  Filled 2019-09-06 (×25): qty 10

## 2019-09-06 MED ORDER — METHYLPREDNISOLONE SODIUM SUCC 40 MG IJ SOLR
40.0000 mg | Freq: Two times a day (BID) | INTRAMUSCULAR | Status: DC
  Administered 2019-09-06 – 2019-09-07 (×4): 40 mg via INTRAVENOUS
  Filled 2019-09-06 (×4): qty 1

## 2019-09-06 NOTE — ED Notes (Signed)
Ordered diet tray 

## 2019-09-06 NOTE — ED Notes (Signed)
Tele

## 2019-09-06 NOTE — ED Notes (Signed)
Lunch Tray Ordered @ 1100. 

## 2019-09-06 NOTE — ED Notes (Signed)
Admit doctor at bedside

## 2019-09-06 NOTE — ED Notes (Signed)
Breakfast Ordered 

## 2019-09-06 NOTE — ED Notes (Signed)
RN found pt w/o any vital monitoring equipment.  Once placed on monitor, pt's O2 was 79%.  He had taken his O2 off.  O2 [place] was reapplied, and increased to 4L.  Pt's O2 returned to normal levels.  Will continue to monitor.

## 2019-09-06 NOTE — ED Notes (Addendum)
ED TO INPATIENT HANDOFF REPORT  ED Nurse Name and Phone #: Thurmond Butts [place] Name/Age/Gender Gabriel Lewis 85 y.o. male Room/Bed: 014C/014C  Code Status   Code Status: Full Code  Home/SNF/Other Home Patient oriented to: self Is this baseline? No   Triage Complete: Triage complete  Chief Complaint Cough,losing weight  Triage Note Pt's wife states he has been SOB, coughing up stuff for the month of Nov. Unable to walk without being out of breath. Pt refused to go to MD. Denies CP. Pt's sats 86% on room air. No known exposure to covid.    Allergies Allergies  Allergen Reactions  . Statins Other (See Comments)    myalgias    Level of Care/Admitting Diagnosis ED Disposition    ED Disposition Condition [facility]: [place] [[id number]]  Level of Care: Telemetry Medical [104]  Covid Evaluation: Asymptomatic Screening Protocol (No Symptoms)  Diagnosis: CAP (community acquired pneumonia) [166063]  Admitting Physician: Mckinley Jewel [[id number]]  Attending Physician: Mckinley Jewel [phone]  Estimated length of stay: past midnight tomorrow  Certification:: I certify this patient will need inpatient services for at least 2 midnights  PT Class (Do Not Modify): Inpatient [101]  PT Acc Code (Do Not Modify): Private [1]       B Medical/Surgery History Past Medical History:  Diagnosis Date  . Arthritis   . BPH (benign prostatic hyperplasia)   . CAD (coronary artery disease)   . Diabetes mellitus   . Diverticulosis   . ED (erectile dysfunction)   . Esophageal stricture   . Gastric ulcer   . GERD (gastroesophageal reflux disease)   . Hiatal hernia   . Hyperlipidemia   . Hypertension   . Hypogonadism male   . Internal hemorrhoids   . Iron deficiency anemia   . Iron deficiency anemia 07/25/2014  . MGUS (monoclonal gammopathy of unknown significance) 07/25/2014  . Pneumonia    Past Surgical History:  Procedure Laterality Date   . APPENDECTOMY    . BASAL CELL CARCINOMA EXCISION    . CORONARY ARTERY BYPASS GRAFT  1995   LIMA-LAD, svg-[id number], svg-rca  . INGUINAL HERNIA REPAIR     Bilateral  . TONSILLECTOMY       A IV Location/Drains/Wounds Patient Lines/Drains/Airways Status   Active Line/Drains/Airways    Name:   Placement date:   Placement time:   Site:   Days:   Peripheral IV 09/05/19 Right Forearm   09/05/19    1450    Forearm   1   External Urinary Catheter   09/05/19    1633    -   1          Intake/Output Last 24 hours  Intake/Output Summary (Last 24 hours) at 09/06/2019 1522 Last data filed at 09/06/2019 0830 Gross per 24 hour  Intake 350 ml  Output 700 ml  Net -350 ml    Labs/Imaging Results for orders placed or performed during the hospital encounter of 09/05/19 (from the past 48 hour(s))  Comprehensive metabolic panel     Status: Abnormal   Collection Time: 09/05/19  1:38 PM  Result Value Ref Range   Sodium 140 135 - 145 mmol/L   Potassium 3.4 (L) 3.5 - 5.1 mmol/L   Chloride 95 (L) 98 - 111 mmol/L   CO2 33 (H) 22 - 32 mmol/L   Glucose, Bld 168 (H) 70 - 99 mg/dL   BUN 34 (H) 8 - 23 mg/dL  Creatinine, Ser 1.10 0.61 - 1.24 mg/dL   Calcium 9.4 8.9 - 10.3 mg/dL   Total Protein 7.6 6.5 - 8.1 g/dL   Albumin 2.9 (L) 3.5 - 5.0 g/dL   AST 11 (L) 15 - 41 U/L   ALT 10 0 - 44 U/L   Alkaline Phosphatase 51 38 - 126 U/L   Total Bilirubin 0.7 0.3 - 1.2 mg/dL   GFR calc non Af Amer >60 >60 mL/min   GFR calc Af Amer >60 >60 mL/min   Anion gap 12 5 - 15    Comment: Performed at [facility] [address], [place], [place] [address]  CBC with Differential     Status: Abnormal   Collection Time: 09/05/19  1:38 PM  Result Value Ref Range   WBC 11.0 (H) 4.0 - 10.5 K/uL   RBC 4.23 4.22 - 5.81 MIL/uL   Hemoglobin 11.9 (L) 13.0 - 17.0 g/dL   HCT 38.0 (L) 39.0 - 52.0 %   MCV 89.8 80.0 - 100.0 fL   MCH 28.1 26.0 - 34.0 pg   MCHC 31.3 30.0 - 36.0 g/dL   RDW 15.4 11.5 - 15.5 %   Platelets  395 150 - 400 K/uL   nRBC 0.0 0.0 - 0.2 %   Neutrophils Relative % 84 %   Neutro Abs 9.3 (H) 1.7 - 7.7 K/uL   Lymphocytes Relative 6 %   Lymphs Abs 0.6 (L) 0.7 - 4.0 K/uL   Monocytes Relative 9 %   Monocytes Absolute 1.0 0.1 - 1.0 K/uL   Eosinophils Relative 0 %   Eosinophils Absolute 0.0 0.0 - 0.5 K/uL   Basophils Relative 0 %   Basophils Absolute 0.0 0.0 - 0.1 K/uL   Immature Granulocytes 1 %   Abs Immature Granulocytes 0.05 0.00 - 0.07 K/uL    Comment: Performed at [facility], [place] [address], [place], [place] [address]  Lactic acid, plasma     Status: None   Collection Time: 09/05/19  2:38 PM  Result Value Ref Range   Lactic Acid, Venous 1.7 0.5 - 1.9 mmol/L    Comment: Performed at [facility] [address] [place], [place] [address]  HIV Antibody (routine testing w rflx)     Status: None   Collection Time: 09/05/19  2:38 PM  Result Value Ref Range   HIV Screen 4th Generation wRfx NON REACTIVE NON REACTIVE    Comment: Performed at [facility], [place] [address] [place], [place] [address]  Culture, blood (routine x 2)     Status: None (Preliminary result)   Collection Time: 09/05/19  2:50 PM   Specimen: BLOOD RIGHT ARM  Result Value Ref Range   Specimen Description BLOOD RIGHT ARM    Special Requests      BOTTLES DRAWN AEROBIC AND ANAEROBIC Blood Culture adequate volume   Culture      NO GROWTH < 24 HOURS Performed at [facility], [place] [address] [place], [place] [address]    Report Status PENDING   POC SARS Coronavirus 2 Ag-ED - Nasal Swab (BD Veritor Kit)     Status: None   Collection Time: 09/05/19  3:29 PM  Result Value Ref Range   SARS Coronavirus 2 Ag NEGATIVE NEGATIVE    Comment: (NOTE) SARS-CoV-2 antigen NOT DETECTED.  Negative results are presumptive.  Negative results do not preclude SARS-CoV-2 infection and should not be used as the sole basis for treatment or other  patient management decisions, including infection  control  decisions, particularly in the presence of clinical signs and  symptoms consistent with COVID-19, or in those who have been in contact with the virus.  Negative results must be combined with clinical observations, patient history, and epidemiological information. The expected result is Negative. Fact Sheet for Patients: PodPark.tn Fact Sheet for Healthcare Providers: GiftContent.is This test is not yet approved or cleared by the [place] FDA and  has been authorized for detection and/or diagnosis of SARS-CoV-2 by FDA under an Emergency Use Authorization (EUA).  This EUA will remain in effect (meaning this test can be used) for the duration of  the COVID-19 de claration under Section 564(b)(1) of the Act, 21 U.S.C. section 360bbb-3(b)(1), unless the authorization is terminated or revoked sooner.   SARS CORONAVIRUS 2 (TAT 6-24 HRS) Nasopharyngeal Nasopharyngeal Swab     Status: None   Collection Time: 09/05/19  3:43 PM   Specimen: Nasopharyngeal Swab  Result Value Ref Range   SARS Coronavirus 2 NEGATIVE NEGATIVE    Comment: (NOTE) SARS-CoV-2 target nucleic acids are NOT DETECTED. The SARS-CoV-2 RNA is generally detectable in upper and lower respiratory specimens during the acute phase of infection. Negative results do not preclude SARS-CoV-2 infection, do not rule out co-infections with other pathogens, and should not be used as the sole basis for treatment or other patient management decisions. Negative results must be combined with clinical observations, patient history, and epidemiological information. The expected result is Negative. Fact Sheet for Patients: SugarRoll.be Fact Sheet for Healthcare Providers: https://www.woods-mathews.com/ This test is not yet approved or cleared by the [place] FDA and  has been authorized for detection and/or diagnosis of SARS-CoV-2  by FDA under an Emergency Use Authorization (EUA). This EUA will remain  in effect (meaning this test can be used) for the duration of the COVID-19 declaration under Section 56 4(b)(1) of the Act, 21 U.S.C. section 360bbb-3(b)(1), unless the authorization is terminated or revoked sooner. Performed at [facility], [place] [address], [place], [place] [address]   Hemoglobin A1c     Status: Abnormal   Collection Time: 09/05/19  4:16 PM  Result Value Ref Range   Hgb A1c MFr Bld 7.3 (H) 4.8 - 5.6 %    Comment: (NOTE) Pre diabetes:          5.7%-6.4% Diabetes:              >6.4% Glycemic control for   <7.0% adults with diabetes    Mean Plasma Glucose 162.81 mg/dL    Comment: Performed at [facility] [address], [place], [place] [address]  Troponin I (High Sensitivity)     Status: Abnormal   Collection Time: 09/05/19  4:20 PM  Result Value Ref Range   Troponin I (High Sensitivity) 22 (H) <18 ng/L    Comment: (NOTE) Elevated high sensitivity troponin I (hsTnI) values and significant  changes across serial measurements may suggest ACS but many other  chronic and acute conditions are known to elevate hsTnI results.  Refer to the "Links" section for chest pain algorithms and additional  guidance. Performed at [facility], [place] [address], [place],  [address]   Procalcitonin - Baseline     Status: None   Collection Time: 09/05/19  4:20 PM  Result Value Ref Range   Procalcitonin <0.10 ng/mL    Comment:        Interpretation: PCT (Procalcitonin) <= 0.5 ng/mL: Systemic infection (sepsis) is  not likely. Local bacterial infection is possible. (NOTE)       Sepsis PCT Algorithm           Lower Respiratory Tract                                      Infection PCT Algorithm    ----------------------------     ----------------------------         PCT < 0.25 ng/mL                PCT < 0.10 ng/mL         Strongly encourage             Strongly discourage    discontinuation of antibiotics    initiation of antibiotics    ----------------------------     -----------------------------       PCT 0.25 - 0.50 ng/mL            PCT 0.10 - 0.25 ng/mL               OR       >80% decrease in PCT            Discourage initiation of                                            antibiotics      Encourage discontinuation           of antibiotics    ----------------------------     -----------------------------         PCT >= 0.50 ng/mL              PCT 0.26 - 0.50 ng/mL               AND        <80% decrease in PCT             Encourage initiation of                                             antibiotics       Encourage continuation           of antibiotics    ----------------------------     -----------------------------        PCT >= 0.50 ng/mL                  PCT > 0.50 ng/mL               AND         increase in PCT                  Strongly encourage                                      initiation of antibiotics    Strongly encourage escalation           of antibiotics                                     -----------------------------  PCT <= 0.25 ng/mL                                                 OR                                        > 80% decrease in PCT                                     Discontinue / Do not initiate                                             antibiotics Performed at [facility], [place] [address], [place], [place] [address]   Culture, blood (routine x 2)     Status: None (Preliminary result)   Collection Time: 09/05/19  4:24 PM   Specimen: BLOOD RIGHT HAND  Result Value Ref Range   Specimen Description BLOOD RIGHT HAND    Special Requests      BOTTLES DRAWN AEROBIC AND ANAEROBIC Blood Culture adequate volume   Culture      NO GROWTH < 24 HOURS Performed at [facility], [place] [address], [place], [place] [address]    Report Status PENDING   CBG monitoring, ED      Status: Abnormal   Collection Time: 09/05/19  5:46 PM  Result Value Ref Range   Glucose-Capillary 106 (H) 70 - 99 mg/dL   Comment 1 Notify RN    Comment 2 Document in Chart   CBG monitoring, ED     Status: Abnormal   Collection Time: 09/05/19 10:12 PM  Result Value Ref Range   Glucose-Capillary 123 (H) 70 - 99 mg/dL   Comment 1 Notify RN    Comment 2 Document in Chart   Strep pneumoniae urinary antigen     Status: None   Collection Time: 09/06/19  8:30 AM  Result Value Ref Range   Strep Pneumo Urinary Antigen NEGATIVE NEGATIVE    Comment:        Infection due to S. pneumoniae cannot be absolutely ruled out since the antigen present may be below the detection limit of the test. Performed at [facility], [address] [place], [place] [address]   Basic metabolic panel     Status: Abnormal   Collection Time: 09/06/19  8:40 AM  Result Value Ref Range   Sodium 142 135 - 145 mmol/L   Potassium 3.4 (L) 3.5 - 5.1 mmol/L   Chloride 101 98 - 111 mmol/L   CO2 31 22 - 32 mmol/L   Glucose, Bld 103 (H) 70 - 99 mg/dL   BUN 25 (H) 8 - 23 mg/dL   Creatinine, Ser 0.86 0.61 - 1.24 mg/dL   Calcium 8.7 (L) 8.9 - 10.3 mg/dL   GFR calc non Af Amer >60 >60 mL/min   GFR calc Af Amer >60 >60 mL/min   Anion gap 10 5 - 15    Comment: Performed at [facility], [place] [address] [place], [place] [address]  CBC     Status: Abnormal   Collection Time: 09/06/19  8:40 AM  Result Value Ref Range   WBC 10.5 4.0 - 10.5 K/uL   RBC 3.94 (L) 4.22 - 5.81 MIL/uL   Hemoglobin 11.1 (L) 13.0 - 17.0 g/dL   HCT 36.3 (L) 39.0 - 52.0 %   MCV 92.1 80.0 - 100.0 fL   MCH 28.2 26.0 - 34.0 pg   MCHC 30.6 30.0 - 36.0 g/dL   RDW 15.3 11.5 - 15.5 %   Platelets 344 150 - 400 K/uL   nRBC 0.0 0.0 - 0.2 %    Comment: Performed at [facility], [place] [address], [place], [place] [address]  Urinalysis, Routine w reflex microscopic     Status: Abnormal   Collection Time: 09/06/19  9:16 AM  Result Value  Ref Range   Color, Urine YELLOW YELLOW   APPearance HAZY (A) CLEAR   Specific Gravity, Urine 1.017 1.005 - 1.030   pH 6.0 5.0 - 8.0   Glucose, UA NEGATIVE NEGATIVE mg/dL   Hgb urine dipstick SMALL (A) NEGATIVE   Bilirubin Urine NEGATIVE NEGATIVE   Ketones, ur 5 (A) NEGATIVE mg/dL   Protein, ur 100 (A) NEGATIVE mg/dL   Nitrite NEGATIVE NEGATIVE   Leukocytes,Ua MODERATE (A) NEGATIVE   RBC / HPF 6-10 0 - 5 RBC/hpf   WBC, UA >50 (H) 0 - 5 WBC/hpf   Bacteria, UA FEW (A) NONE SEEN   Squamous Epithelial / LPF 0-5 0 - 5    Comment: Performed at [facility], [place] [address] [place], [place] [address]  CBG monitoring, ED     Status: None   Collection Time: 09/06/19  9:40 AM  Result Value Ref Range   Glucose-Capillary 88 70 - 99 mg/dL  CBG monitoring, ED     Status: Abnormal   Collection Time: 09/06/19 11:56 AM  Result Value Ref Range   Glucose-Capillary 122 (H) 70 - 99 mg/dL   Comment 1 Notify RN    Comment 2 Document in Chart    Dg Chest 2 View  Result Date: 09/05/2019 CLINICAL DATA:  Shortness of breath EXAM: CHEST - 2 VIEW COMPARISON:  08/23/2008 FINDINGS: Prior CABG changes. Heart size appears mildly enlarged although is largely obscured. Patchy right lower lobe airspace opacity. Left lung appears clear. No pleural effusion or pneumothorax. Exaggerated thoracic kyphosis. IMPRESSION: 1. Patchy airspace opacity at the right lung base is suspicious for pneumonia. 2. Mild cardiomegaly. Electronically Signed   By: Davina Poke M.D.   On: 09/05/2019 14:15    Pending Labs Unresulted Labs (From admission, onward)    Start     Ordered   09/12/19 0500  Creatinine, serum  (enoxaparin (LOVENOX)    CrCl >/= 30 ml/min)  Weekly,   R    Comments: while on enoxaparin therapy    09/05/19 1557   09/07/19 0500  CBC  Daily,   R     09/06/19 1029   09/07/19 4854  Basic metabolic panel  Daily,   R     09/06/19 1029   09/05/19 1618  Magnesium  Add-on,   AD     09/05/19 1617   09/05/19  1557  Culture, sputum-assessment  Once,   R     09/05/19 1557   09/05/19 1557  Legionella Pneumophila Serogp 1 Ur Ag  Once,   STAT     09/05/19 1557   09/05/19 1554  HIV Antibody (routine testing w rflx)  (  HIV Antibody (Routine testing w reflex) panel)  Once,   STAT     09/05/19 1557   09/05/19 1438  Lactic acid, plasma  Now then every 2 hours,   STAT     09/05/19 1437          Vitals/Pain Today's Vitals   09/06/19 1315 09/06/19 1330 09/06/19 1342 09/06/19 1345  BP: 129/74 133/64  128/71  Pulse: 96 97 (!) 105 (!) 59  Resp: (!) 34 (!) 39 (!) 24 19  Temp:      TempSrc:      SpO2: 92% 92% 95% 95%  PainSc:        Isolation Precautions No active isolations  Medications Medications  enoxaparin (LOVENOX) injection 40 mg (40 mg Subcutaneous Given 09/06/19 0011)  0.9 %  sodium chloride infusion ( Intravenous New Bag/Given 09/06/19 0404)  insulin aspart (novoLOG) injection 0-15 Units (0 Units Subcutaneous Not Given 09/06/19 1158)  insulin aspart (novoLOG) injection 0-5 Units (0 Units Subcutaneous Not Given 09/05/19 2213)  cefTRIAXone (ROCEPHIN) 1 g in sodium chloride 0.9 % 100 mL IVPB (1 g Intravenous New Bag/Given 09/06/19 1509)  azithromycin (ZITHROMAX) 500 mg in sodium chloride 0.9 % 250 mL IVPB (has no administration in time range)  albuterol (PROVENTIL) (2.5 MG/3ML) 0.083% nebulizer solution 2.5 mg (has no administration in time range)  aspirin EC tablet 81 mg (81 mg Oral Given 09/06/19 0959)  amLODipine (NORVASC) tablet 10 mg (10 mg Oral Given 09/06/19 0957)  memantine (NAMENDA) tablet 10 mg (10 mg Oral Given 09/06/19 0010)  pantoprazole (PROTONIX) EC tablet 40 mg (40 mg Oral Given 09/06/19 0957)  mirabegron ER (MYRBETRIQ) tablet 50 mg (50 mg Oral Given 09/06/19 0959)  tamsulosin (FLOMAX) capsule 0.4 mg (0.4 mg Oral Given 09/06/19 0957)  cholecalciferol (VITAMIN D3) tablet 2,000 Units (2,000 Units Oral Given 09/06/19 0957)  guaiFENesin-dextromethorphan (ROBITUSSIN DM) 100-10 MG/5ML syrup  10 mL (10 mLs Oral Given 09/06/19 1102)  methylPREDNISolone sodium succinate (SOLU-MEDROL) 40 mg/mL injection 40 mg (40 mg Intravenous Given 09/06/19 1101)  levalbuterol (XOPENEX) nebulizer solution 1.25 mg (1.25 mg Nebulization Given 09/06/19 1342)  sodium chloride flush (NS) 0.9 % injection 3 mL (3 mLs Intravenous Given 09/05/19 1450)  cefTRIAXone (ROCEPHIN) 1 g in sodium chloride 0.9 % 100 mL IVPB ( Intravenous Stopped 09/05/19 1528)  azithromycin (ZITHROMAX) 500 mg in sodium chloride 0.9 % 250 mL IVPB (0 mg Intravenous Stopped 09/05/19 1746)  potassium chloride SA (KLOR-CON) CR tablet 40 mEq (40 mEq Oral Given 09/05/19 1739)    Mobility walks with person assist High fall risk   Focused Assessments    R Recommendations: See Admitting Provider Note  Report given to: Caroline Sauger RN  Additional Notes:

## 2019-09-06 NOTE — Progress Notes (Signed)
PROGRESS NOTE    Patient: Gabriel Lewis                            PCP: Shon Baton, MD                    DOB: [birth year]            DOA: 09/05/2019 [id number]             DOS: 09/06/2019, 10:11 AM   LOS: 1 day   Date of Service: The patient was seen and examined on 09/06/2019  Subjective:   The patient was seen and examined this morning, remained stable on 3 L of oxygen satting approximately 95%.  Awake alert laying comfortably in bed oriented only x1.  Continue to have persistent cough, productive at times. To have shortness of breath but denies any chest pain. Wife present at bedside. Wife is confirmed that he is a full code, patient nods and agrees If also confirmed that he is not usually O2 dependent with no history of asthma COPD or emphysema.   Brief Narrative:   Gabriel Lewis is a 85 y.o. male with medical history significant of coronary artery disease status post CABG in 1995, diabetes, MGUS, hypertension, BPH, GERD, hyperlipidemia, anemia presents  With cough congestion, shortness of breath, hypoxia.  On admission O2 sat 86% on room air, currently on 3 L, satting 95%. Patient confirmed by imaging to have pneumonia, SIRS.  ED-presentation: Hypoxic 86% on room air, tachypneic, WBC 11, lactic acid within normal limits,  SARS-CoV-2 x2 - Chest x-ray-right lower lobe consolidation, mild cardiomegaly, Blood cultures obtained, initiated IV antibiotics Rocephin, azithromycin   09/06/2019 -patient remains hypoxic, and respiratory failure on 3 L of oxygen, satting 95%, continues to have cough, congestion, on IV antibiotics, gentle IV fluid hydration  Assessment & Plan:   Principal Problem:   CAP (community acquired pneumonia) Active Problems:   Diabetes mellitus ([place])   Hyperlipidemia   GERD (gastroesophageal reflux disease)   BPH (benign prostatic hyperplasia)   Hypertension   Anemia   Hypokalemia   Protein calorie malnutrition (HCC)   Dehydration   Acute  respiratory failure with hypoxia (HCC)   Acute respiratory failure with hypoxia due to pneumonia-SIRS -Pneumonia-SIRS causing acute respiratory failure -Remains on 3 L of oxygen, satting 95%,, temp 100.2, pulse 90, RR 20, BP 139/78, Lactic acid 1.7,-Still mildly tachypneic, tachycardic, pro-Cal <0.01 -SARS-CoV-2 x2 - negative -gentle IV fluid hydration, -Continue IV antibiotics Rocephin/azithromycin -Follow-up with blood/sputum cultures -Monitoring labs, procalcitonin level, urine Legionella, urine strep antigen, -Chest x-ray consistent with right lower lobe pneumonia -Continue O2 supplements, DuoNeb bronchodilators, mucolytic's -Initiating small, short dose of steroids  SIRS/pneumonia -Management as above -Ruling out sepsis -We will follow the blood and sputum cultures accordingly -Continue IV fluid, IV antibiotics  Dehydration: -Continue gentle IV fluid hydration normal saline -Secondary to decreased p.o. intake. -Patient's BUN elevated at 34, CO2 of 33, lactic acid: WNL, UA is pending -Patient appears to be very dehydrated on exam  Hypokalemia: -Monitoring and repleting accordingly -Likely secondary to decreased p.o. intake -Check magnesium level. -Replaced.  Repeat BMP   GERD: Stable continue Protonix  BPH/urinary issues: Stable continue Flomax and Myrbetriq  Diabetes mellitus:  A1c: 7.3 -Continue to hold home medication of Metformin -Poor p.o. intake, would hold being on diabetic medication -We will check blood sugar QA CHS, SSI   Hypertension: -Monitoring for potential hypotension due to  dehydration, infection/SIRS/sepsis -Currently on amlodipine   Protein calorie malnutrition: -Albumin: 2.9 -Consulted dietitian.  Coronary artery disease status post CABG in 1995: Stable -Continue aspirin  Dementia: Continue Namenda  DVT prophylaxis: TED/SCD/Lovenox Code Status: Full code -confirmed by wife was present at bedside Family Communication:  Patient's wife present at bedside.  Plan of care discussed with patient and his wife in length and they verbalized understanding and agreed with it. Disposition Plan: TBD Consults called: None Admission status: Admitted and will remain as an inpatient due to acute respiratory failure, pneumonia, SIRS requiring close monitoring IV antibiotics O2 via nasal cannula      Procedures:   No admission procedures for hospital encounter.   Antimicrobials:  Anti-infectives (From admission, onward)   Start     Dose/Rate Route Frequency Ordered Stop   09/06/19 1600  azithromycin (ZITHROMAX) 500 mg in sodium chloride 0.9 % 250 mL IVPB     500 mg 250 mL/hr over 60 Minutes Intravenous Every 24 hours 09/05/19 1619     09/06/19 1500  cefTRIAXone (ROCEPHIN) 1 g in sodium chloride 0.9 % 100 mL IVPB     1 g 200 mL/hr over 30 Minutes Intravenous Every 24 hours 09/05/19 1619     09/05/19 1445  cefTRIAXone (ROCEPHIN) 1 g in sodium chloride 0.9 % 100 mL IVPB     1 g 200 mL/hr over 30 Minutes Intravenous  Once 09/05/19 1438 09/05/19 1528   09/05/19 1445  azithromycin (ZITHROMAX) 500 mg in sodium chloride 0.9 % 250 mL IVPB     500 mg 250 mL/hr over 60 Minutes Intravenous  Once 09/05/19 1438 09/05/19 1746       Medication:  . amLODipine  10 mg Oral Q breakfast  . aspirin EC  81 mg Oral Daily  . cholecalciferol  2,000 Units Oral Q breakfast  . enoxaparin (LOVENOX) injection  40 mg Subcutaneous Q24H  . insulin aspart  0-15 Units Subcutaneous TID WC  . insulin aspart  0-5 Units Subcutaneous QHS  . memantine  10 mg Oral QHS  . mirabegron ER  50 mg Oral Q breakfast  . pantoprazole  40 mg Oral Daily  . tamsulosin  0.4 mg Oral Q breakfast    albuterol   Objective:   Vitals:   09/06/19 0915 09/06/19 0930 09/06/19 0945 09/06/19 1000  BP: (!) 141/70 (!) 161/69 (!) 179/73 139/78  Pulse: 82 87 85 90  Resp: 20 17 20 20   Temp:      TempSrc:      SpO2: 98% 100% 96% 95%    Intake/Output Summary  (Last 24 hours) at 09/06/2019 1011 Last data filed at 09/05/2019 1746 Gross per 24 hour  Intake 350 ml  Output -  Net 350 ml   There were no vitals filed for this visit.   Examination:   Physical Exam  Constitution: Awake alert x1 cooperative, in mild respiratory distress, on 2 L of oxygen,,  Appears calm and comfortable  Psychiatric: Normal and stable mood and affect, cognition intact,   HEENT: Normocephalic, PERRL, otherwise with in Normal limits  Chest:Chest symmetric Cardio vascular:  S1/S2, RRR, No murmure, No Rubs or Gallops  pulmonary: Clear to auscultation bilaterally, respirations unlabored, negative wheezes / crackles Abdomen: Soft, non-tender, non-distended, bowel sounds,no masses, no organomegaly Muscular skeletal: Limited exam - in bed, able to move all 4 extremities, Normal strength,  Neuro: CNII-XII intact. , normal motor and sensation, reflexes intact  Extremities: No pitting edema lower extremities, +2 pulses  Skin: Dry,  warm to touch, negative for any Rashes, No open wounds Wounds: per nursing documentation  LABs:  CBC Latest Ref Rng & Units 09/06/2019 09/05/2019 07/11/2019  WBC 4.0 - 10.5 K/uL 10.5 11.0(H) 5.8  Hemoglobin 13.0 - 17.0 g/dL 11.1(L) 11.9(L) 11.0(L)  Hematocrit 39.0 - 52.0 % 36.3(L) 38.0(L) 34.9(L)  Platelets 150 - 400 K/uL 344 395 291   CMP Latest Ref Rng & Units 09/05/2019 07/11/2019 07/05/2018  Glucose 70 - 99 mg/dL 168(H) 180(H) 145(H)  BUN 8 - 23 mg/dL 34(H) 24(H) 20  Creatinine 0.61 - 1.24 mg/dL 1.10 1.06 1.05  Sodium 135 - 145 mmol/L 140 140 140  Potassium 3.5 - 5.1 mmol/L 3.4(L) 4.4 4.1  Chloride 98 - 111 mmol/L 95(L) 97(L) 98  CO2 22 - 32 mmol/L 33(H) 33(H) 35(H)  Calcium 8.9 - 10.3 mg/dL 9.4 9.4 9.7  Total Protein 6.5 - 8.1 g/dL 7.6 7.1 7.1  Total Bilirubin 0.3 - 1.2 mg/dL 0.7 0.6 0.8  Alkaline Phos 38 - 126 U/L 51 62 59  AST 15 - 41 U/L 11(L) 9(L) 9(L)  ALT 0 - 44 U/L 10 <6 7        SIGNED: Deatra James, MD, FACP, FHM.  [facility],  Pager [phone]  If 7PM-7AM, please contact night-coverage [email or web address] Ota [facility] 09/06/2019, 10:11 AM

## 2019-09-07 DIAGNOSIS — E43 Unspecified severe protein-calorie malnutrition: Secondary | ICD-10-CM | POA: Insufficient documentation

## 2019-09-07 DIAGNOSIS — L8990 Pressure ulcer of unspecified site, unspecified stage: Secondary | ICD-10-CM | POA: Insufficient documentation

## 2019-09-07 LAB — BASIC METABOLIC PANEL
Anion gap: 12 (ref 5–15)
BUN: 25 mg/dL — ABNORMAL HIGH (ref 8–23)
CO2: 28 mmol/L (ref 22–32)
Calcium: 8.6 mg/dL — ABNORMAL LOW (ref 8.9–10.3)
Chloride: 100 mmol/L (ref 98–111)
Creatinine, Ser: 0.76 mg/dL (ref 0.61–1.24)
GFR calc Af Amer: >60 mL/min (ref >60)
GFR calc non Af Amer: >60 mL/min (ref >60)
Glucose, Bld: 235 mg/dL — ABNORMAL HIGH (ref 70–99)
Potassium: 3.8 mmol/L (ref 3.5–5.1)
Sodium: 140 mmol/L (ref 135–145)

## 2019-09-07 LAB — CBC
HCT: 35.5 % — ABNORMAL LOW (ref 39.0–52.0)
Hemoglobin: 11 g/dL — ABNORMAL LOW (ref 13.0–17.0)
MCH: 27.8 pg (ref 26.0–34.0)
MCHC: 31 g/dL (ref 30.0–36.0)
MCV: 89.9 fL (ref 80.0–100.0)
Platelets: 364 10*3/uL (ref 150–400)
RBC: 3.95 MIL/uL — ABNORMAL LOW (ref 4.22–5.81)
RDW: 15 % (ref 11.5–15.5)
WBC: 12.1 10*3/uL — ABNORMAL HIGH (ref 4.0–10.5)
nRBC: 0 % (ref 0.0–0.2)

## 2019-09-07 LAB — LEGIONELLA PNEUMOPHILA SEROGP 1 UR AG: L. pneumophila Serogp 1 Ur Ag: NEGATIVE

## 2019-09-07 LAB — MAGNESIUM: Magnesium: 1.5 mg/dL — ABNORMAL LOW (ref 1.7–2.4)

## 2019-09-07 LAB — GLUCOSE, CAPILLARY
Glucose-Capillary: 184 mg/dL — ABNORMAL HIGH (ref 70–99)
Glucose-Capillary: 274 mg/dL — ABNORMAL HIGH (ref 70–99)
Glucose-Capillary: 217 mg/dL — ABNORMAL HIGH (ref 70–99)
Glucose-Capillary: 232 mg/dL — ABNORMAL HIGH (ref 70–99)

## 2019-09-07 MED ORDER — POTASSIUM CHLORIDE CRYS ER 20 MEQ PO TBCR
40.0000 meq | EXTENDED_RELEASE_TABLET | Freq: Once | ORAL | Status: AC
  Administered 2019-09-07: 40 meq via ORAL
  Filled 2019-09-07: qty 2

## 2019-09-07 MED ORDER — MAGNESIUM SULFATE 2 GM/50ML IV SOLN
2.0000 g | Freq: Once | INTRAVENOUS | Status: AC
  Administered 2019-09-07: 2 g via INTRAVENOUS
  Filled 2019-09-07: qty 50

## 2019-09-07 MED ORDER — LEVALBUTEROL HCL 1.25 MG/0.5ML IN NEBU
1.2500 mg | INHALATION_SOLUTION | Freq: Three times a day (TID) | RESPIRATORY_TRACT | Status: DC
  Administered 2019-09-07 – 2019-09-11 (×11): 1.25 mg via RESPIRATORY_TRACT
  Filled 2019-09-07 (×12): qty 0.5

## 2019-09-07 MED ORDER — ENSURE ENLIVE PO LIQD
237.0000 mL | Freq: Two times a day (BID) | ORAL | Status: DC
  Administered 2019-09-07 – 2019-09-12 (×10): 237 mL via ORAL

## 2019-09-07 NOTE — Evaluation (Signed)
Physical Therapy Evaluation Patient Details Name: Gabriel Lewis MRN: [id number] DOB: [birth year] Today's Date: 09/07/2019   History of Present Illness  Pt is an 85 y/o M admitted on 09/05/19 for cough & SOB x 1 month. Pt found to have CAP. Pt with PMH significant for CAD s/p CABG in 1995, DM, MGUS, HTN, HLD.  Clinical Impression  Pt presents with significant cognitive deficits & confusion, and requires mod assist overall for bed mobility & stand pivot transfers. Discussed d/c plans with pt's wife who was present for session - pt's wife declines pt going to SNF, reporting they have an adult son who can assist them 24/7 if necessary & she reports they already have the DME this therapist would recommend. Pt will require 24 hr supervision upon d/c 2/2 significant cognitive & physical deficits.  Will continue to follow pt acutely to focus on bed mobility, transfers, gait, & balance and assist with d/c planning.    Educated pt's wife on pt's inability to drive until cleared by MD. Pt perseverative on going home with pt's wife stating at beginning of session that the pt is "driving her crazy" - encouraged pt's wife to go home & rest but she reports she wishes to stay.     Pt's RR ranged from 23-38 during session & RN aware. Pt on 4L/min supplemental oxygen during session.    Follow Up Recommendations Home health PT;Supervision/Assistance - 24 hour    Equipment Recommendations  None recommended by PT(pt's wife reports they have a RW & w/c already)    Recommendations for Other Services       Precautions / Restrictions Precautions Precautions: Fall Restrictions Weight Bearing Restrictions: No      Mobility  Bed Mobility Overal bed mobility: Needs Assistance Bed Mobility: Sit to Supine;Supine to Sit     Supine to sit: Mod assist;HOB elevated Sit to supine: Min assist   General bed mobility comments: cuing for initiation, extra time, assistance to upright trunk even with use of hospital  bed features  Transfers Overall transfer level: Needs assistance   Transfers: Sit to/from Stand;Stand Pivot Transfers Sit to Stand: Mod assist Stand pivot transfers: Mod assist       General transfer comment: mod assist stand pivot to w/c on L with max cuing for hand placement, HHA, and pt with posterior lean during transfer, unable to fully stand upright  Ambulation/Gait                Stairs            Wheelchair Mobility    Modified Rankin (Stroke Patients Only)       Balance Overall balance assessment: Needs assistance Sitting-balance support: Feet supported;Bilateral upper extremity supported Sitting balance-Leahy Scale: Poor Sitting balance - Comments: CGA static sitting balance EOB     Standing balance-Leahy Scale: Poor Standing balance comment: mod assist for stand pivot with HHA/UE support                             Pertinent Vitals/Pain Pain Assessment: Faces Faces Pain Scale: No hurt    Home Living Family/patient expects to be discharged to:: Private residence Living Arrangements: Spouse/significant other Available Help at Discharge: Family;Available 24 hours/day(pt lives with elderly wife who reports they have an adult son who doesn't work who can assist 24/7 if needed) Type of Home: House Home Access: Stairs to enter Entrance Stairs-Rails: None Entrance Stairs-Number of Steps: 1(small threshold step) Home  Layout: One level Home Equipment: Walker - 2 wheels;Wheelchair - manual      Prior Function Level of Independence: Needs assistance         Comments: pt's wife reports pt refused to use any AD's except for RW for sit>stand from couch, pt was driving PTA (pt's wife reports pt would sneak out at night to drive)     Hand Dominance        Extremity/Trunk Assessment   Upper Extremity Assessment Upper Extremity Assessment: Generalized weakness    Lower Extremity Assessment Lower Extremity Assessment: Generalized  weakness    Cervical / Trunk Assessment Cervical / Trunk Assessment: Kyphotic(forward head, rounded shoulders, posterior pelvic tilt)  Communication      Cognition Arousal/Alertness: Awake/alert Behavior During Therapy: Restless Overall Cognitive Status: Impaired/Different from baseline Area of Impairment: Orientation;Attention;Safety/judgement;Awareness;Following commands;Problem solving;Memory                 Orientation Level: Place;Time;Situation Current Attention Level: Sustained Memory: Decreased recall of precautions;Decreased short-term memory Following Commands: Follows one step commands inconsistently;Follows one step commands with increased time Safety/Judgement: Decreased awareness of deficits;Decreased awareness of safety Awareness: Intellectual Problem Solving: Slow processing;Decreased initiation;Difficulty sequencing;Requires verbal cues General Comments: pt very confused, asking to go home multiple times, only oriented to self      General Comments      Exercises     Assessment/Plan    PT Assessment Patient needs continued PT services  PT Problem List Decreased strength;Decreased mobility;Decreased safety awareness;Decreased knowledge of precautions;Decreased coordination;Decreased range of motion;Decreased activity tolerance;Decreased cognition;Cardiopulmonary status limiting activity;Decreased skin integrity;Impaired sensation;Decreased balance;Decreased knowledge of use of DME       PT Treatment Interventions DME instruction;Therapeutic exercise;Wheelchair mobility training;Manual techniques;Balance training;Gait training;Stair training;Modalities;Neuromuscular re-education;Functional mobility training;Cognitive remediation;Therapeutic activities;Patient/family education    PT Goals (Current goals can be found in the Care Plan section)  Acute Rehab PT Goals Patient Stated Goal: to go home PT Goal Formulation: With patient/family Time For Goal  Achievement: 09/21/19 Potential to Achieve Goals: Fair    Frequency Min 3X/week   Barriers to discharge        Co-evaluation               AM-PAC PT "6 Clicks" Mobility  Outcome Measure Help needed turning from your back to your side while in a flat bed without using bedrails?: A Little Help needed moving from lying on your back to sitting on the side of a flat bed without using bedrails?: A Little Help needed moving to and from a bed to a chair (including a wheelchair)?: A Lot Help needed standing up from a chair using your arms (e.g., wheelchair or bedside chair)?: A Lot Help needed to walk in hospital room?: A Lot Help needed climbing 3-5 steps with a railing? : Total 6 Click Score: 13    End of Session Equipment Utilized During Treatment: Gait belt;Oxygen Activity Tolerance: Patient tolerated treatment well Patient left: in chair;with chair alarm set;with call bell/phone within reach;with family/visitor present Nurse Communication: Mobility status(pt left in w/c) PT Visit Diagnosis: Unsteadiness on feet (R26.81);Muscle weakness (generalized) (M62.81);Difficulty in walking, not elsewhere classified (R26.2)    Time: [id number] PT Time Calculation (min) (ACUTE ONLY): 25 min   Charges:   PT Evaluation $PT Eval Moderate Complexity: 1 Mod PT Treatments $Therapeutic Activity: 8-22 mins           Waunita Schooner, PT, DPT 09/07/2019, 3:59 PM

## 2019-09-07 NOTE — Progress Notes (Signed)
Patient had a 6 beat run of V-Tach. Patient was asymptomatic with WNL vital signs and no chest pain. Elmahi,MD returned by page asking what the patient's potassium level was. This RN gave the MD the most recent potassium level of 3.4, which was on the morning of 12/1. MD gave verbal order for RN to give patient 40 mEq of PO potassium by tablets. Order placed with verbal readback. RN also made MD aware of patient's MEWS score fluctuating between a 1-green and 2 yellow due to the patient's respirations ranging in the 20's to 30's. Patient is not in distress. Patient's respirations only enter the 30's range when staff is working with him in his room. Will continue to monitor and treat per MD orders.

## 2019-09-07 NOTE — Progress Notes (Signed)
PROGRESS NOTE    Patient: Gabriel Lewis                            PCP: Shon Baton, MD                    DOB: 1931/10/19            DOA: 09/05/2019 [id number]             DOS: 09/07/2019, 2:03 PM   LOS: 2 days   Date of Service: The patient was seen and examined on 09/07/2019  Subjective:   The patient was seen and examined this morning, much more awake alert following command. To demand has not improved, remains to be on 3 L of oxygen, satting 98%, off O2 he was as low as 82%.  Continues to have productive cough. No major issues overnight.   Brief Narrative:   Gabriel Lewis is a 85 y.o. male with medical history significant of coronary artery disease status post CABG in 1995, diabetes, MGUS, hypertension, BPH, GERD, hyperlipidemia, anemia presents  With cough congestion, shortness of breath, hypoxia.  On admission O2 sat 86% on room air, currently on 3 L, satting 95%. Patient confirmed by imaging to have pneumonia, SIRS.  ED-presentation: Hypoxic 86% on room air, tachypneic, WBC 11, lactic acid within normal limits,  SARS-CoV-2 x2 - Chest x-ray-right lower lobe consolidation, mild cardiomegaly, Blood cultures obtained, initiated IV antibiotics Rocephin, azithromycin   09/06/2019 -patient remains hypoxic, and respiratory failure on 3 L of oxygen, satting 95%, continues to have cough, congestion, on IV antibiotics, gentle IV fluid hydration  Assessment & Plan:   Principal Problem:   CAP (community acquired pneumonia) Active Problems:   Diabetes mellitus ([place])   Hyperlipidemia   GERD (gastroesophageal reflux disease)   BPH (benign prostatic hyperplasia)   Hypertension   Anemia   Hypokalemia   Protein calorie malnutrition (HCC)   Dehydration   Acute respiratory failure with hypoxia (HCC)   Pressure injury of skin   Acute respiratory failure with hypoxia due to pneumonia-SIRS -Pneumonia-SIRS causing acute respiratory failure -On admission room air 82%, O2 sat  improved on 3 L of oxygen, currently 98%  -T-max 100.2, currently 98.8, improved cardiac, remains tachypneic with respirate of 23,  Lactic acid 1.7,-mildly tachypneic, tachycardic, pro-Cal <0.01 -SARS-CoV-2 x2 - negative -Continue with gentle IV fluid hydration, -Continue IV antibiotics Rocephin/azithromycin -Follow-up with blood/sputum cultures -Monitoring labs, procalcitonin level, urine Legionella, urine strep antigen, -Chest x-ray consistent with right lower lobe pneumonia -Continue O2 supplements, DuoNeb bronchodilators, mucolytic's -Initiating small, short dose of steroids  SIRS/pneumonia -Management as above -Ruling out sepsis, bacteremia -We will follow the blood and sputum cultures accordingly -Continue IV fluid, IV antibiotics  Dehydration: -Stable improving -Continue gentle IV fluid hydration normal saline -Secondary to decreased p.o. intake. -Patient's BUN elevated at 34, CO2 of 33, lactic acid: WNL, UA is pending -Patient appears to be very dehydrated on exam  Hypokalemia: -Monitoring and repleting accordingly -Likely secondary to decreased p.o. intake -Check magnesium level. -Replaced.  Repeat BMP   GERD: Stable continue Protonix  BPH/urinary issues: Stable continue Flomax and Myrbetriq  Diabetes mellitus:  A1c: 7.3 -Continue to hold home medication of Metformin -Poor p.o. intake, would hold being on diabetic medication -We will check blood sugar QA CHS, SSI  Hypertension: -Monitoring for potential hypotension due to dehydration, infection/SIRS/sepsis -Currently on amlodipine  Protein calorie malnutrition: -Albumin: 2.9 -Consulted dietitian .Marland Kitchen  Continue dietary supplements  Coronary artery disease status post CABG in 1995: Stable -Continue aspirin  Dementia: Continue Namenda  DVT prophylaxis: TED/SCD/Lovenox Code Status: Full code -confirmed by wife was present at bedside Family Communication: Patient's wife present at bedside.  Plan of care  discussed with patient and his wife in length and they verbalized understanding and agreed with it. Disposition Plan: TBD Consults called: None Admission status: Admitted and will remain as an inpatient due to acute respiratory failure, pneumonia, SIRS requiring close monitoring IV antibiotics O2 via nasal cannula      Procedures:   No admission procedures for hospital encounter.   Antimicrobials:  Anti-infectives (From admission, onward)   Start     Dose/Rate Route Frequency Ordered Stop   09/06/19 1600  azithromycin (ZITHROMAX) 500 mg in sodium chloride 0.9 % 250 mL IVPB     500 mg 250 mL/hr over 60 Minutes Intravenous Every 24 hours 09/05/19 1619     09/06/19 1500  cefTRIAXone (ROCEPHIN) 1 g in sodium chloride 0.9 % 100 mL IVPB     1 g 200 mL/hr over 30 Minutes Intravenous Every 24 hours 09/05/19 1619     09/05/19 1445  cefTRIAXone (ROCEPHIN) 1 g in sodium chloride 0.9 % 100 mL IVPB     1 g 200 mL/hr over 30 Minutes Intravenous  Once 09/05/19 1438 09/05/19 1528   09/05/19 1445  azithromycin (ZITHROMAX) 500 mg in sodium chloride 0.9 % 250 mL IVPB     500 mg 250 mL/hr over 60 Minutes Intravenous  Once 09/05/19 1438 09/05/19 1746       Medication:  . amLODipine  10 mg Oral Q breakfast  . aspirin EC  81 mg Oral Daily  . cholecalciferol  2,000 Units Oral Q breakfast  . enoxaparin (LOVENOX) injection  40 mg Subcutaneous Q24H  . guaiFENesin-dextromethorphan  10 mL Oral Q6H  . insulin aspart  0-15 Units Subcutaneous TID WC  . insulin aspart  0-5 Units Subcutaneous QHS  . levalbuterol  1.25 mg Nebulization TID  . memantine  10 mg Oral QHS  . methylPREDNISolone (SOLU-MEDROL) injection  40 mg Intravenous Q12H  . mirabegron ER  50 mg Oral Q breakfast  . pantoprazole  40 mg Oral Daily  . tamsulosin  0.4 mg Oral Q breakfast    albuterol   Objective:   Vitals:   09/07/19 0625 09/07/19 0627 09/07/19 0630 09/07/19 0839  BP:      Pulse: 81 84 69 79  Resp: 20 18 17  (!) 23   Temp:      TempSrc:      SpO2: 97% 96% 97% 98%  There is no height or weight on file to calculate BMI.   Intake/Output Summary (Last 24 hours) at 09/07/2019 1403 Last data filed at 09/07/2019 1300 Gross per 24 hour  Intake 1549.69 ml  Output 250 ml  Net 1299.69 ml   There were no vitals filed for this visit.   Examination:   BP 114/64 (BP Location: Right Arm)   Pulse 79   Temp 98.7 F (37.1 C) (Oral)   Resp (!) 23   SpO2 98%    Physical Exam  Constitution:  Alert, cooperative, no distress,  Psychiatric: Normal and stable mood and affect, cognition intact,   HEENT: Normocephalic, PERRL, otherwise with in Normal limits  Chest:Chest symmetric Cardio vascular:  S1/S2, RRR, No murmure, No Rubs or Gallops  pulmonary: Clear to auscultation bilaterally, respirations unlabored, negative wheezes / crackles Abdomen: Soft, non-tender, non-distended, bowel  sounds,no masses, no organomegaly Muscular skeletal: Limited exam - in bed, able to move all 4 extremities, Normal strength,  Neuro: CNII-XII intact. , normal motor and sensation, reflexes intact  Extremities: No pitting edema lower extremities, +2 pulses  Skin: Dry, warm to touch, negative for any Rashes, No open wounds Wounds: per nursing documentation Pressure Injury 09/06/19 Sacrum Mid Stage II -  Partial thickness loss of dermis presenting as a shallow open ulcer with a red, pink wound bed without slough. (Active)  09/06/19 1701  Location: Sacrum  Location Orientation: Mid  Staging: Stage II -  Partial thickness loss of dermis presenting as a shallow open ulcer with a red, pink wound bed without slough.  Wound Description (Comments):   Present on Admission: Yes      LABs:  CBC Latest Ref Rng & Units 09/07/2019 09/06/2019 09/05/2019  WBC 4.0 - 10.5 K/uL 12.1(H) 10.5 11.0(H)  Hemoglobin 13.0 - 17.0 g/dL 11.0(L) 11.1(L) 11.9(L)  Hematocrit 39.0 - 52.0 % 35.5(L) 36.3(L) 38.0(L)  Platelets 150 - 400 K/uL 364 344 395   CMP  Latest Ref Rng & Units 09/07/2019 09/06/2019 09/05/2019  Glucose 70 - 99 mg/dL 235(H) 103(H) 168(H)  BUN 8 - 23 mg/dL 25(H) 25(H) 34(H)  Creatinine 0.61 - 1.24 mg/dL 0.76 0.86 1.10  Sodium 135 - 145 mmol/L 140 142 140  Potassium 3.5 - 5.1 mmol/L 3.8 3.4(L) 3.4(L)  Chloride 98 - 111 mmol/L 100 101 95(L)  CO2 22 - 32 mmol/L 28 31 33(H)  Calcium 8.9 - 10.3 mg/dL 8.6(L) 8.7(L) 9.4  Total Protein 6.5 - 8.1 g/dL - - 7.6  Total Bilirubin 0.3 - 1.2 mg/dL - - 0.7  Alkaline Phos 38 - 126 U/L - - 51  AST 15 - 41 U/L - - 11(L)  ALT 0 - 44 U/L - - 10        SIGNED: Deatra James, MD, FACP, FHM. [facility],  Pager [phone]  If 7PM-7AM, please contact night-coverage [email or web address] Ota [facility] 09/07/2019, 2:03 PM

## 2019-09-07 NOTE — Progress Notes (Signed)
   Vital Signs MEWS/VS Documentation      09/07/2019 0839 09/07/2019 0841 09/07/2019 0900 09/07/2019 1405   MEWS Score:  1  1  1  2    MEWS Score Color:  Green  Green  Green  Yellow   Resp:  (!) 23  -  -  (!) 31   Pulse:  79  -  -  94   O2 Device:  Nasal Cannula  -  -  Nasal Cannula   O2 Flow Rate (L/min):  3 L/min  -  -  3 L/min   Level of Consciousness:  -  -  Alert  -    Patient admitted with CAP.  RR =23.  Patient in no apparent distress.  No work in breathing.  Calm and comfortable.         Kyeisha Janowicz A Herschell Virani 09/07/2019,2:11 PM

## 2019-09-07 NOTE — Progress Notes (Signed)
Initial Nutrition Assessment  DOCUMENTATION CODES:   Severe malnutrition in context of chronic illness  INTERVENTION:   - Ensure Enlive po BID, each supplement provides 350 kcal and 20 grams of protein  - Magic cup BID with meals, each supplement provides 290 kcal and 9 grams of protein  - Encourage adequate PO intake  NUTRITION DIAGNOSIS:   Severe Malnutrition related to chronic illness as evidenced by moderate fat depletion, severe fat depletion, moderate muscle depletion, severe muscle depletion.  GOAL:   Patient will meet greater than or equal to 90% of their needs  MONITOR:   Supplement acceptance, Labs, PO intake, Weight trends, Skin  REASON FOR ASSESSMENT:   Consult Poor PO, Assessment of nutrition requirement/status  ASSESSMENT:   85 year old male who presented to the ED on 11/30 with SOB. PMH of MGUS, CAD s/p CABG in 1995, DM, GERD, HTN, HLD. Chest x-ray shows right lung base pneumonia and mild cardiomegaly.   Spoke with pt and wife at bedside. Respiratory therapist arrived during visit to provide breathing treatment. 32 of history obtained from pt's wife.  Pt's wife reports that pt's UBW is 180 lbs but that he last weighed this "months ago." Pt's wife endorses a steady decline in weight over a long period of time.  Pt's wife states that pt typically eats 3 meals daily. Pt drinks coffee, Sprite, and water (occasionally) throughout the day.  Breakfast: egg sandwich or bowl of grits Lunch: sandwich Dinner: starch, protein, vegetable (pt eats tablespoons of each food item)  Pt's wife shares that pt mostly eats candy throughout the day and fills up on this. She notes that pt prefers sweeter foods as he ages. Pt reports that he has a "great" appetite.  Pt's wife shares that pt has also been having some memory/cognitive issues and has been requesting to drive at night.  No admission weight recorded. Noted pt with a 3.8 kg weight loss from 01/28/19 to 07/18/19.  This is a 5.7% weight loss which is not significant for timeframe.  Noted completed lunch tray at bedside. Pt had consumed ~90% of his lunch tray.  Meal Completion: 90% x 1 recorded meal  Medications reviewed and include: cholecalciferol, SSI, Solu-medrol, Protonix, IV abx IVF: NS @ 100 ml/hr  Labs reviewed: magnesium 1.5 CBG's: 151-274 x 24 hours  NUTRITION - FOCUSED PHYSICAL EXAM:    Most Recent Value  Orbital Region  Moderate depletion  Upper Arm Region  Severe depletion  Thoracic and Lumbar Region  Severe depletion  Buccal Region  Moderate depletion  Temple Region  Severe depletion  Clavicle Bone Region  Severe depletion  Clavicle and Acromion Bone Region  Severe depletion  Scapular Bone Region  Unable to assess  Dorsal Hand  Moderate depletion  Patellar Region  Severe depletion  Anterior Thigh Region  Severe depletion  Posterior Calf Region  Severe depletion  Edema (RD Assessment)  None  Hair  Reviewed  Eyes  Reviewed  Mouth  Reviewed  Skin  Reviewed  Nails  Reviewed       Diet Order:   Diet Order            Diet regular Room service appropriate? Yes; Fluid consistency: Thin  Diet effective now              EDUCATION NEEDS:   Education needs have been addressed  Skin:  Skin Assessment: Skin Integrity Issues: Stage II: sacrum  Last BM:  no documented BM  Height:   Ht Readings from Last  1 Encounters:  07/18/19 5\' 7"  (1.702 m)    Weight:   Wt Readings from Last 1 Encounters:  07/18/19 62.4 kg    Ideal Body Weight:  67.3 kg  BMI:  21.54 kg/m^2  Estimated Nutritional Needs:   Kcal:  1650-1850  Protein:  75-90 grams  Fluid:  >/= 1.6 L    Gaynell Face, MS, RD, LDN Inpatient Clinical Dietitian Pager: [phone] Weekend/After Hours: [phone]

## 2019-09-08 LAB — BASIC METABOLIC PANEL
Anion gap: 6 (ref 5–15)
BUN: 28 mg/dL — ABNORMAL HIGH (ref 8–23)
CO2: 32 mmol/L (ref 22–32)
Calcium: 8.2 mg/dL — ABNORMAL LOW (ref 8.9–10.3)
Chloride: 102 mmol/L (ref 98–111)
Creatinine, Ser: 0.79 mg/dL (ref 0.61–1.24)
GFR calc Af Amer: >60 mL/min (ref >60)
GFR calc non Af Amer: >60 mL/min (ref >60)
Glucose, Bld: 173 mg/dL — ABNORMAL HIGH (ref 70–99)
Potassium: 4.5 mmol/L (ref 3.5–5.1)
Sodium: 140 mmol/L (ref 135–145)

## 2019-09-08 LAB — CBC
HCT: 31.3 % — ABNORMAL LOW (ref 39.0–52.0)
Hemoglobin: 9.8 g/dL — ABNORMAL LOW (ref 13.0–17.0)
MCH: 27.7 pg (ref 26.0–34.0)
MCHC: 31.3 g/dL (ref 30.0–36.0)
MCV: 88.4 fL (ref 80.0–100.0)
Platelets: 342 10*3/uL (ref 150–400)
RBC: 3.54 MIL/uL — ABNORMAL LOW (ref 4.22–5.81)
RDW: 14.8 % (ref 11.5–15.5)
WBC: 13.5 10*3/uL — ABNORMAL HIGH (ref 4.0–10.5)
nRBC: 0 % (ref 0.0–0.2)

## 2019-09-08 LAB — GLUCOSE, CAPILLARY
Glucose-Capillary: 185 mg/dL — ABNORMAL HIGH (ref 70–99)
Glucose-Capillary: 324 mg/dL — ABNORMAL HIGH (ref 70–99)
Glucose-Capillary: 314 mg/dL — ABNORMAL HIGH (ref 70–99)
Glucose-Capillary: 197 mg/dL — ABNORMAL HIGH (ref 70–99)

## 2019-09-08 MED ORDER — AZITHROMYCIN 250 MG PO TABS
500.0000 mg | ORAL_TABLET | Freq: Every day | ORAL | Status: DC
  Administered 2019-09-08 – 2019-09-10 (×3): 500 mg via ORAL
  Filled 2019-09-08: qty 2
  Filled 2019-09-08 (×2): qty 1

## 2019-09-08 MED ORDER — BACID PO TABS
2.0000 | ORAL_TABLET | Freq: Three times a day (TID) | ORAL | Status: DC
  Administered 2019-09-08 – 2019-09-12 (×12): 2 via ORAL
  Filled 2019-09-08 (×16): qty 2

## 2019-09-08 MED ORDER — PREDNISONE 20 MG PO TABS
50.0000 mg | ORAL_TABLET | Freq: Every day | ORAL | Status: DC
  Administered 2019-09-08 – 2019-09-10 (×3): 50 mg via ORAL
  Filled 2019-09-08: qty 2
  Filled 2019-09-08 (×2): qty 1

## 2019-09-08 NOTE — Progress Notes (Signed)
PROGRESS NOTE    Patient: Gabriel Lewis                            PCP: Shon Baton, MD                    DOB: 1931-12-30            DOA: 09/05/2019 [id number]             DOS: 09/08/2019, 1:03 PM   LOS: 3 days   Date of Service: The patient was seen and examined on 09/08/2019  Subjective:   The patient was seen and examined this morning, very pleasant in bed comfortable.  No complaint of chest pain or shortness of breath. Still on 3 L of oxygen satting 100%. He briefly took the oxygen off his nose during the examination within few minutes his O2 sat dropped to 83%.  Oxygen was placed back by his nose, his O2 sat improved to 95%. Otherwise remained stable no complaints.  Brief Narrative:   Gabriel Lewis is a 85 y.o. male with medical history significant of coronary artery disease status post CABG in 1995, diabetes, MGUS, hypertension, BPH, GERD, hyperlipidemia, anemia presents  With cough congestion, shortness of breath, hypoxia.  On admission O2 sat 86% on room air, currently on 3 L, satting 95%. Patient confirmed by imaging to have pneumonia, SIRS.  ED-presentation: Hypoxic 86% on room air, tachypneic, WBC 11, lactic acid within normal limits,  SARS-CoV-2 x2 - Chest x-ray-right lower lobe consolidation, mild cardiomegaly, Blood cultures obtained, initiated IV antibiotics Rocephin, azithromycin   09/06/2019 -patient remains hypoxic, and respiratory failure on 3 L of oxygen, satting 95%, continues to have cough, congestion, on IV antibiotics, gentle IV fluid hydration  09/07/2019 -remained stable continue to require 3 L of oxygen continue current antibiotics IV steroids   10/05/2019 patient remains on 3 L of oxygen 100%, desats off oxygen.  Tapering down IV steroids switching to p.o. continue antibiotics  Assessment & Plan:   Principal Problem:   CAP (community acquired pneumonia) Active Problems:   Diabetes mellitus ([place])   Hyperlipidemia   GERD (gastroesophageal reflux  disease)   BPH (benign prostatic hyperplasia)   Hypertension   Anemia   Hypokalemia   Protein calorie malnutrition ([facility])   Dehydration   Acute respiratory failure with hypoxia ([facility])   Pressure injury of skin   Protein-calorie malnutrition, severe   Acute respiratory failure with hypoxia due to pneumonia-SIRS -Improved tachypnea, tachycardia, currently normotensive, afebrile. -Still requiring 3 L of oxygen to maintain O2 sat greater 92% With a trial off oxygen patient desatted to 85%. -Continue incentive spirometer, ambulation today -Tapering down steroids, continue antibiotics.  DC IV fluids   -Pneumonia-SIRS causing acute respiratory failure -On admission room air 82%,-T-max 100.2, Lactic acid 1.7,-mildly tachypneic, tachycardic, pro-Cal <0.01 -SARS-CoV-2 x2 - negative -Continue IV antibiotics Rocephin/azithromycin -Blood/urine cultures >>> negative to date -Monitoring labs, procalcitonin level, urine Legionella, urine strep antigen, -Chest x-ray consistent with right lower lobe pneumonia -Continue O2 supplements, DuoNeb bronchodilators, mucolytic's -Initiating small, short dose of steroids  SIRS/pneumonia -Much improved proved, management as above -Sepsis/bacteremia ruled out -Deseeding IV fluids, continue antibiotics  Dehydration: -Continue to improve, stable -Status post IV fluid hydration, DC today 09/08/2019 -Secondary to decreased p.o. intake. -Patient's BUN elevated at 34, CO2 of 33, lactic acid: WNL, UA is pending -Patient appears to be very dehydrated on exam  Hypokalemia: -Monitoring and repleting  accordingly -Likely secondary to decreased p.o. intake -Check magnesium level. -Replaced.  Repeat BMP   GERD: Stable continue Protonix  BPH/urinary issues: Stable continue Flomax and Myrbetriq  Diabetes mellitus:  A1c: 7.3 -Continue to hold home medication of Metformin -Poor p.o. intake, would hold being on diabetic medication -We will check blood sugar  QA CHS, SSI  Hypertension: -Monitoring for potential hypotension due to dehydration, infection/SIRS/sepsis -Currently on amlodipine  Protein calorie malnutrition: -Albumin: 2.9 -Consulted dietitian .Marland Kitchen Continue dietary supplements  Coronary artery disease status post CABG in 1995: Stable -Continue aspirin  Dementia: Continue Namenda  DVT prophylaxis: TED/SCD/Lovenox Code Status: Full code -confirmed by wife was present at bedside Family Communication: Patient's wife present at bedside.  Plan of care discussed with patient and his wife in length and they verbalized understanding and agreed with it. Disposition Plan: TBD Consults called: None Admission status: Admitted and will remain as an inpatient due to acute respiratory failure, pneumonia, SIRS requiring close monitoring IV antibiotics O2 via nasal cannula      Procedures:   No admission procedures for hospital encounter.   Antimicrobials:  Anti-infectives (From admission, onward)   Start     Dose/Rate Route Frequency Ordered Stop   09/08/19 1000  azithromycin (ZITHROMAX) tablet 500 mg     500 mg Oral Daily 09/08/19 0710     09/06/19 1600  azithromycin (ZITHROMAX) 500 mg in sodium chloride 0.9 % 250 mL IVPB  Status:  Discontinued     500 mg 250 mL/hr over 60 Minutes Intravenous Every 24 hours 09/05/19 1619 09/08/19 0710   09/06/19 1500  cefTRIAXone (ROCEPHIN) 1 g in sodium chloride 0.9 % 100 mL IVPB     1 g 200 mL/hr over 30 Minutes Intravenous Every 24 hours 09/05/19 1619     09/05/19 1445  cefTRIAXone (ROCEPHIN) 1 g in sodium chloride 0.9 % 100 mL IVPB     1 g 200 mL/hr over 30 Minutes Intravenous  Once 09/05/19 1438 09/05/19 1528   09/05/19 1445  azithromycin (ZITHROMAX) 500 mg in sodium chloride 0.9 % 250 mL IVPB     500 mg 250 mL/hr over 60 Minutes Intravenous  Once 09/05/19 1438 09/05/19 1746       Medication:  . amLODipine  10 mg Oral Q breakfast  . aspirin EC  81 mg Oral Daily  . azithromycin  500  mg Oral Daily  . cholecalciferol  2,000 Units Oral Q breakfast  . enoxaparin (LOVENOX) injection  40 mg Subcutaneous Q24H  . feeding supplement (ENSURE ENLIVE)  237 mL Oral BID BM  . guaiFENesin-dextromethorphan  10 mL Oral Q6H  . insulin aspart  0-15 Units Subcutaneous TID WC  . insulin aspart  0-5 Units Subcutaneous QHS  . lactobacillus acidophilus  2 tablet Oral TID  . levalbuterol  1.25 mg Nebulization TID  . memantine  10 mg Oral QHS  . pantoprazole  40 mg Oral Daily  . predniSONE  50 mg Oral Q breakfast  . tamsulosin  0.4 mg Oral Q breakfast    albuterol   Objective:   Vitals:   09/08/19 0200 09/08/19 0420 09/08/19 0600 09/08/19 0742  BP: 113/72  131/64 131/64  Pulse: 67  (!) 53 80  Resp: (!) 22  (!) 21 14  Temp:  97.9 F (36.6 C)    TempSrc:  Axillary    SpO2: 95%  98% 100%  There is no height or weight on file to calculate BMI.   Intake/Output Summary (Last 24 hours) at  09/08/2019 1303 Last data filed at 09/08/2019 0900 Gross per 24 hour  Intake 1830 ml  Output 200 ml  Net 1630 ml   There were no vitals filed for this visit.   Examination:   BP 131/64   Pulse 80   Temp 97.9 F (36.6 C) (Axillary)   Resp 14   SpO2 100%    Physical Exam  Constitution:  Alert, cooperative, , mild shortness of breath, nonlabored breathing, on 3 L of oxygen Psychiatric: Normal and stable mood and affect, cognition intact,   HEENT: Normocephalic, PERRL, otherwise with in Normal limits  Chest:Chest symmetric Cardio vascular:  S1/S2, RRR, No murmure, No Rubs or Gallops  pulmonary: Diffuse Rales, negative any rhonchi's respirations unlabored, negative wheezes / crackles Abdomen: Soft, non-tender, non-distended, bowel sounds,no masses, no organomegaly Muscular skeletal: Limited exam - in bed, able to move all 4 extremities, Normal strength,  Neuro: CNII-XII intact. , normal motor and sensation, reflexes intact  Extremities: No pitting edema lower extremities, +2 pulses  Skin:  Dry, warm to touch, negative for any Rashes, No open wounds Wounds: per nursing documentation Pressure Injury 09/06/19 Sacrum Mid Stage II -  Partial thickness loss of dermis presenting as a shallow open ulcer with a red, pink wound bed without slough. (Active)  09/06/19 1701  Location: Sacrum  Location Orientation: Mid  Staging: Stage II -  Partial thickness loss of dermis presenting as a shallow open ulcer with a red, pink wound bed without slough.  Wound Description (Comments):   Present on Admission: Yes       LABs:  CBC Latest Ref Rng & Units 09/08/2019 09/07/2019 09/06/2019  WBC 4.0 - 10.5 K/uL 13.5(H) 12.1(H) 10.5  Hemoglobin 13.0 - 17.0 g/dL 9.8(L) 11.0(L) 11.1(L)  Hematocrit 39.0 - 52.0 % 31.3(L) 35.5(L) 36.3(L)  Platelets 150 - 400 K/uL 342 364 344   CMP Latest Ref Rng & Units 09/08/2019 09/07/2019 09/06/2019  Glucose 70 - 99 mg/dL 173(H) 235(H) 103(H)  BUN 8 - 23 mg/dL 28(H) 25(H) 25(H)  Creatinine 0.61 - 1.24 mg/dL 0.79 0.76 0.86  Sodium 135 - 145 mmol/L 140 140 142  Potassium 3.5 - 5.1 mmol/L 4.5 3.8 3.4(L)  Chloride 98 - 111 mmol/L 102 100 101  CO2 22 - 32 mmol/L 32 28 31  Calcium 8.9 - 10.3 mg/dL 8.2(L) 8.6(L) 8.7(L)  Total Protein 6.5 - 8.1 g/dL - - -  Total Bilirubin 0.3 - 1.2 mg/dL - - -  Alkaline Phos 38 - 126 U/L - - -  AST 15 - 41 U/L - - -  ALT 0 - 44 U/L - - -        SIGNED: Deatra James, MD, FACP, FHM. [facility],  Pager [phone]  If 7PM-7AM, please contact night-coverage [email or web address], Password [facility] 09/08/2019, 1:03 PM

## 2019-09-08 NOTE — Progress Notes (Signed)
Physical Therapy Treatment Patient Details Name: Gabriel Lewis MRN: [id number] DOB: 1932/02/04 Today's Date: 09/08/2019    History of Present Illness Pt is an 85 y/o M admitted on 09/05/19 for cough & SOB x 1 month. Pt found to have CAP. Pt with PMH significant for CAD s/p CABG in 1995, DM, MGUS, HTN, HLD.    PT Comments    Patient received in bed, pleasant and motivated to work with therapy today, eager to get OOB. Mobility much improved today- able to perform bed mobility with min guardx2 for safety/lines, sit to stand in stedy with MinAx2 and no significant posterior LOB when practicing balance (cross midline reaching) without UE support. Did go into run of V-tach (visually confirmed on monitor) which resolved after 3-4 minutes of return to sitting on stedy pads. Due to V-tach, opted to pivot to chair in stedy for safety. RN aware of V-tach and poor signal on SpO2 monitor. Educated him on how to correctly use incentive spirometer, returned correct demonstration. He was left up in the chair with all needs met,  Chair alarm active this morning.    Follow Up Recommendations  Home health PT;Supervision/Assistance - 24 hour     Equipment Recommendations  None recommended by PT    Recommendations for Other Services       Precautions / Restrictions Precautions Precautions: Fall Restrictions Weight Bearing Restrictions: No    Mobility  Bed Mobility Overal bed mobility: Needs Assistance Bed Mobility: Supine to Sit     Supine to sit: Min guard;+2 for safety/equipment     General bed mobility comments: min guardx2 for line management and safety, much improved, cues for initiation and completing tasks  Transfers Overall transfer level: Needs assistance   Transfers: Sit to/from Stand Sit to Stand: Min assist;+2 physical assistance Stand pivot transfers: Total assist       General transfer comment: mobility improving, able to stand iwth MinAx2 in stedy and maintain balance with  no UE support but went into V-tach (visually confirmed on monitor); used stedy instead of trying gait/stand pivot due to V-tach  Ambulation/Gait             General Gait Details: deferred, V-tach   Stairs             Wheelchair Mobility    Modified Rankin (Stroke Patients Only)       Balance Overall balance assessment: Needs assistance Sitting-balance support: Feet supported;Bilateral upper extremity supported Sitting balance-Leahy Scale: Fair Sitting balance - Comments: S for static midline balance     Standing balance-Leahy Scale: Fair Standing balance comment: able to maintain balance with no UE support/min guard                            Cognition Arousal/Alertness: Awake/alert Behavior During Therapy: Flat affect Overall Cognitive Status: Impaired/Different from baseline Area of Impairment: Orientation;Attention;Safety/judgement;Awareness;Following commands;Problem solving;Memory                 Orientation Level: Disoriented to;Place Current Attention Level: Sustained Memory: Decreased recall of precautions;Decreased short-term memory Following Commands: Follows one step commands with increased time;Follows one step commands consistently Safety/Judgement: Decreased awareness of deficits;Decreased awareness of safety Awareness: Intellectual Problem Solving: Slow processing;Decreased initiation;Difficulty sequencing;Requires verbal cues General Comments: confusion improvign but remains, initially states he is in [place]; often asking "what do you want me to do"      Exercises      General Comments  Pertinent Vitals/Pain Pain Assessment: No/denies pain Pain Score: 0-No pain Pain Intervention(s): Limited activity within patient's tolerance;Monitored during session    Home Living                      Prior Function            PT Goals (current goals can now be found in the care plan section) Acute Rehab PT  Goals Patient Stated Goal: to go home PT Goal Formulation: With patient/family Time For Goal Achievement: 09/21/19 Potential to Achieve Goals: Fair Progress towards PT goals: Progressing toward goals    Frequency    Min 3X/week      PT Plan Current plan remains appropriate    Co-evaluation              AM-PAC PT "6 Clicks" Mobility   Outcome Measure  Help needed turning from your back to your side while in a flat bed without using bedrails?: A Little Help needed moving from lying on your back to sitting on the side of a flat bed without using bedrails?: A Little Help needed moving to and from a bed to a chair (including a wheelchair)?: A Little Help needed standing up from a chair using your arms (e.g., wheelchair or bedside chair)?: A Little Help needed to walk in hospital room?: A Lot Help needed climbing 3-5 steps with a railing? : Total 6 Click Score: 15    End of Session Equipment Utilized During Treatment: Gait belt;Oxygen Activity Tolerance: Patient tolerated treatment well Patient left: in chair;with call bell/phone within reach;with chair alarm set Nurse Communication: Mobility status;Other (comment)(v-tach with mobility confirmed on monitor, poor O2 signal) PT Visit Diagnosis: Unsteadiness on feet (R26.81);Muscle weakness (generalized) (M62.81);Difficulty in walking, not elsewhere classified (R26.2)     Time: 1848-5927 PT Time Calculation (min) (ACUTE ONLY): 23 min  Charges:  $Therapeutic Activity: 23-37 mins                     Windell Norfolk, DPT, PN1   Supplemental Physical Therapist [place]    Pager [phone] Acute Rehab Office [phone]

## 2019-09-09 LAB — CBC
HCT: 29.1 % — ABNORMAL LOW (ref 39.0–52.0)
Hemoglobin: 9.2 g/dL — ABNORMAL LOW (ref 13.0–17.0)
MCH: 27.8 pg (ref 26.0–34.0)
MCHC: 31.6 g/dL (ref 30.0–36.0)
MCV: 87.9 fL (ref 80.0–100.0)
Platelets: 368 10*3/uL (ref 150–400)
RBC: 3.31 MIL/uL — ABNORMAL LOW (ref 4.22–5.81)
RDW: 14.9 % (ref 11.5–15.5)
WBC: 11.7 10*3/uL — ABNORMAL HIGH (ref 4.0–10.5)
nRBC: 0 % (ref 0.0–0.2)

## 2019-09-09 LAB — BASIC METABOLIC PANEL
Anion gap: 8 (ref 5–15)
BUN: 38 mg/dL — ABNORMAL HIGH (ref 8–23)
CO2: 31 mmol/L (ref 22–32)
Calcium: 8.5 mg/dL — ABNORMAL LOW (ref 8.9–10.3)
Chloride: 98 mmol/L (ref 98–111)
Creatinine, Ser: 0.88 mg/dL (ref 0.61–1.24)
GFR calc Af Amer: >60 mL/min (ref >60)
GFR calc non Af Amer: >60 mL/min (ref >60)
Glucose, Bld: 216 mg/dL — ABNORMAL HIGH (ref 70–99)
Potassium: 4 mmol/L (ref 3.5–5.1)
Sodium: 137 mmol/L (ref 135–145)

## 2019-09-09 LAB — GLUCOSE, CAPILLARY
Glucose-Capillary: 164 mg/dL — ABNORMAL HIGH (ref 70–99)
Glucose-Capillary: 146 mg/dL — ABNORMAL HIGH (ref 70–99)
Glucose-Capillary: 199 mg/dL — ABNORMAL HIGH (ref 70–99)

## 2019-09-09 NOTE — Progress Notes (Signed)
PROGRESS NOTE    Patient: Gabriel Lewis                            PCP: Shon Baton, MD                    DOB: 07-Jan-1932            DOA: 09/05/2019 [id number]             DOS: 09/09/2019, 11:56 AM   LOS: 4 days   Date of Service: The patient was seen and examined on 09/09/2019  Subjective:   The patient was seen and examined this morning, remained stable in no acute distress.  Requested to be discharged home.  But he is still requiring 3 L of oxygen to maintain O2 sat of 96%. At baseline he is not O2 dependent. Continue to have shortness of breath with exertion and desats off oxygen per nursing staff.  The findings plan of care was discussed with the patient's wife... She agrees.  Brief Narrative:   Gabriel Lewis is a 85 y.o. male with medical history significant of coronary artery disease status post CABG in 1995, diabetes, MGUS, hypertension, BPH, GERD, hyperlipidemia, anemia presents  With cough congestion, shortness of breath, hypoxia.  On admission O2 sat 86% on room air, currently on 3 L, satting 95%. Patient confirmed by imaging to have pneumonia, SIRS.  ED-presentation: Hypoxic 86% on room air, tachypneic, WBC 11, lactic acid within normal limits,  SARS-CoV-2 x2 - Chest x-ray-right lower lobe consolidation, mild cardiomegaly, Blood cultures obtained, initiated IV antibiotics Rocephin, azithromycin   09/06/2019 -patient remains hypoxic, and respiratory failure on 3 L of oxygen, satting 95%, continues to have cough, congestion, on IV antibiotics, gentle IV fluid hydration  09/07/2019  -remained stable continue to require 3 L of oxygen continue current antibiotics IV steroids   10/05/2019 patient remains on 3 L of oxygen 100%, desats off oxygen.  Tapering down IV steroids switching to p.o. continue antibiotics  09/09/2019 Patient still remains 3 L of oxygen, per nursing staff desatted when it was tapered down, gets progressive shortness of breath with exertion .Marland Kitchen  Continue antibiotics, tapering down steroids   Assessment & Plan:   Principal Problem:   CAP (community acquired pneumonia) Active Problems:   Diabetes mellitus ([place])   Hyperlipidemia   GERD (gastroesophageal reflux disease)   BPH (benign prostatic hyperplasia)   Hypertension   Anemia   Hypokalemia   Protein calorie malnutrition (HCC)   Dehydration   Acute respiratory failure with hypoxia (HCC)   Pressure injury of skin   Protein-calorie malnutrition, severe   Acute respiratory failure with hypoxia due to pneumonia-SIRS -Continue to improve, afebrile, normotensive, -With exertion desats, -Still requiring 3 L of oxygen at rest to maintain O2 sat greater 92%,-currently 96%. -We will continue to assess.... Taper down O2  -Confirm by his wife he is not O2 dependent at home.  No history of COPD or emphysema. -Continue incentive spirometer, ambulation today -Tapering down steroids, continue antibiotics.  DC IV fluids   -Pneumonia-SIRS causing acute respiratory failure -On admission room air 82%,-T-max 100.2, Lactic acid 1.7,-mildly tachypneic, tachycardic, pro-Cal <0.01 -SARS-CoV-2 x2 - negative -Continue IV antibiotics Rocephin/azithromycin -Blood/urine cultures >>> negative to date -Monitoring labs, procalcitonin level, urine Legionella, urine strep antigen, -Chest x-ray consistent with right lower lobe pneumonia -Continue O2 supplements, DuoNeb bronchodilators, mucolytic's -Initiating small, short dose of steroids  SIRS/pneumonia -Much improved  plan as above -Sepsis/bacteremia ruled out -Deseeding IV fluids, continue antibiotics  Dehydration: -Stable -improved -Status post IV fluid hydration, DC today 09/08/2019 -Secondary to decreased p.o. intake. -Patient's BUN elevated at 34, CO2 of 33, lactic acid: WNL, UA is pending -Patient appears to be very dehydrated on exam  Hypokalemia: -Monitoring and repleting accordingly -Likely secondary to decreased p.o.  intake -Check magnesium level. -Replaced.  Repeat BMP   GERD: Stable continue Protonix  BPH/urinary issues: Stable continue Flomax and Myrbetriq  Diabetes mellitus:  A1c: 7.3 -Continue to hold home medication of Metformin -Poor p.o. intake, would hold being on diabetic medication -We will check blood sugar QA CHS, SSI  Hypertension: -Monitoring for potential hypotension due to dehydration, infection/SIRS/sepsis -Currently on amlodipine  Protein calorie malnutrition: -Albumin: 2.9 -Consulted dietitian .Marland Kitchen Continue dietary supplements  Coronary artery disease status post CABG in 1995: Stable -Continue aspirin  Dementia: Continue Namenda  DVT prophylaxis: TED/SCD/Lovenox Code Status: Full code -confirmed by wife was present at bedside Family Communication:  Patient wife updated, plan of care and discharge were discussed in detail.. She expresses understanding and agreement to current plan.  Disposition Plan:  Planning to discharge home in 1 to 2 days Discharge barrier hypoxia, O2 demand 3 L baseline room air  Consults called: None Admission status: Remains as an inpatient, due to hypoxia acute respiratory failure due to pneumonia    Procedures:   No admission procedures for hospital encounter.   Antimicrobials:  Anti-infectives (From admission, onward)   Start     Dose/Rate Route Frequency Ordered Stop   09/08/19 1000  azithromycin (ZITHROMAX) tablet 500 mg     500 mg Oral Daily 09/08/19 0710     09/06/19 1600  azithromycin (ZITHROMAX) 500 mg in sodium chloride 0.9 % 250 mL IVPB  Status:  Discontinued     500 mg 250 mL/hr over 60 Minutes Intravenous Every 24 hours 09/05/19 1619 09/08/19 0710   09/06/19 1500  cefTRIAXone (ROCEPHIN) 1 g in sodium chloride 0.9 % 100 mL IVPB     1 g 200 mL/hr over 30 Minutes Intravenous Every 24 hours 09/05/19 1619     09/05/19 1445  cefTRIAXone (ROCEPHIN) 1 g in sodium chloride 0.9 % 100 mL IVPB     1 g 200 mL/hr over 30  Minutes Intravenous  Once 09/05/19 1438 09/05/19 1528   09/05/19 1445  azithromycin (ZITHROMAX) 500 mg in sodium chloride 0.9 % 250 mL IVPB     500 mg 250 mL/hr over 60 Minutes Intravenous  Once 09/05/19 1438 09/05/19 1746       Medication:   amLODipine  10 mg Oral Q breakfast   aspirin EC  81 mg Oral Daily   azithromycin  500 mg Oral Daily   cholecalciferol  2,000 Units Oral Q breakfast   enoxaparin (LOVENOX) injection  40 mg Subcutaneous Q24H   feeding supplement (ENSURE ENLIVE)  237 mL Oral BID BM   guaiFENesin-dextromethorphan  10 mL Oral Q6H   insulin aspart  0-15 Units Subcutaneous TID WC   insulin aspart  0-5 Units Subcutaneous QHS   lactobacillus acidophilus  2 tablet Oral TID   levalbuterol  1.25 mg Nebulization TID   memantine  10 mg Oral QHS   pantoprazole  40 mg Oral Daily   predniSONE  50 mg Oral Q breakfast   tamsulosin  0.4 mg Oral Q breakfast    albuterol   Objective:   Vitals:   09/08/19 2020 09/08/19 2122 09/09/19 0400 09/09/19 0500  BP:  117/70 135/71 (!) 153/81  Pulse:   80   Resp:   19   Temp:  98.2 F (36.8 C)  97.9 F (36.6 C)  TempSrc:      SpO2: 98%  98%   There is no height or weight on file to calculate BMI.   Intake/Output Summary (Last 24 hours) at 09/09/2019 1156 Last data filed at 09/09/2019 0515 Gross per 24 hour  Intake 480 ml  Output --  Net 480 ml   There were no vitals filed for this visit.   Examination:   BP (!) 153/81    Pulse 80    Temp 97.9 F (36.6 C)    Resp 19    SpO2 98%    Physical Exam  Constitution:  Alert, cooperative, no distress,  Psychiatric: Normal and stable mood and affect, cognition intact,   HEENT: Normocephalic, PERRL, otherwise with in Normal limits  Chest:Chest symmetric Cardio vascular:  S1/S2, RRR, No murmure, No Rubs or Gallops  pulmonary: Clear to auscultation bilaterally, respirations unlabored, negative wheezes / crackles Abdomen: Soft, non-tender, non-distended, bowel  sounds,no masses, no organomegaly Muscular skeletal: Limited exam - in bed, able to move all 4 extremities, Normal strength,  Neuro: CNII-XII intact. , normal motor and sensation, reflexes intact  Extremities: No pitting edema lower extremities, +2 pulses  Skin: Dry, warm to touch, negative for any Rashes, No open wounds Wounds: per nursing documentation Pressure Injury 09/06/19 Sacrum Mid Stage II -  Partial thickness loss of dermis presenting as a shallow open ulcer with a red, pink wound bed without slough. (Active)  09/06/19 1701  Location: Sacrum  Location Orientation: Mid  Staging: Stage II -  Partial thickness loss of dermis presenting as a shallow open ulcer with a red, pink wound bed without slough.  Wound Description (Comments):   Present on Admission: Yes        LABs:  CBC Latest Ref Rng & Units 09/09/2019 09/08/2019 09/07/2019  WBC 4.0 - 10.5 K/uL 11.7(H) 13.5(H) 12.1(H)  Hemoglobin 13.0 - 17.0 g/dL 9.2(L) 9.8(L) 11.0(L)  Hematocrit 39.0 - 52.0 % 29.1(L) 31.3(L) 35.5(L)  Platelets 150 - 400 K/uL 368 342 364   CMP Latest Ref Rng & Units 09/09/2019 09/08/2019 09/07/2019  Glucose 70 - 99 mg/dL 216(H) 173(H) 235(H)  BUN 8 - 23 mg/dL 38(H) 28(H) 25(H)  Creatinine 0.61 - 1.24 mg/dL 0.88 0.79 0.76  Sodium 135 - 145 mmol/L 137 140 140  Potassium 3.5 - 5.1 mmol/L 4.0 4.5 3.8  Chloride 98 - 111 mmol/L 98 102 100  CO2 22 - 32 mmol/L 31 32 28  Calcium 8.9 - 10.3 mg/dL 8.5(L) 8.2(L) 8.6(L)  Total Protein 6.5 - 8.1 g/dL - - -  Total Bilirubin 0.3 - 1.2 mg/dL - - -  Alkaline Phos 38 - 126 U/L - - -  AST 15 - 41 U/L - - -  ALT 0 - 44 U/L - - -        SIGNED: Deatra James, MD, FACP, FHM. [facility],  Pager [phone]  If 7PM-7AM, please contact night-coverage [email or web address] 09/09/2019, 11:56 AM

## 2019-09-09 NOTE — Progress Notes (Signed)
Inpatient Diabetes Program Recommendations  AACE/ADA: New Consensus Statement on Inpatient Glycemic Control (2015)  Target Ranges:  Prepandial:   less than 140 mg/dL      Peak postprandial:   less than 180 mg/dL (1-2 hours)      Critically ill patients:  140 - 180 mg/dL   Lab Results  Component Value Date   GLUCAP 164 (H) 09/09/2019   HGBA1C 7.3 (H) 09/05/2019    Review of Glycemic Control Results for Gabriel Lewis, Gabriel Lewis (MRN [id number]) as of 09/09/2019 12:10  Ref. Range 09/08/2019 07:51 09/08/2019 12:12 09/08/2019 16:10 09/08/2019 21:21 09/09/2019 07:57  Glucose-Capillary Latest Ref Range: 70 - 99 mg/dL 185 (H) 324 (H) 314 (H) 197 (H) 164 (H)   Diabetes history: DM Outpatient Diabetes medications:  Metformin 500 mg with breakfast Current orders for Inpatient glycemic control:  Novolog moderate tid with meals and HS Prednisone 50 mg daily  Inpatient Diabetes Program Recommendations:   While on Prednisone, consider adding Novolog 3 units tid with meals- MEAL COVERAGE (Hold if patient eats less than 50% or NPO).   Thanks,   Adah Perl, RN, BC-ADM Inpatient Diabetes Coordinator Pager [phone] (8a-5p)

## 2019-09-10 DIAGNOSIS — N390 Urinary tract infection, site not specified: Secondary | ICD-10-CM

## 2019-09-10 DIAGNOSIS — E43 Unspecified severe protein-calorie malnutrition: Secondary | ICD-10-CM

## 2019-09-10 LAB — CULTURE, BLOOD (ROUTINE X 2)
Culture: NO GROWTH
Culture: NO GROWTH
Special Requests: ADEQUATE
Special Requests: ADEQUATE

## 2019-09-10 LAB — BASIC METABOLIC PANEL
Anion gap: 8 (ref 5–15)
BUN: 32 mg/dL — ABNORMAL HIGH (ref 8–23)
CO2: 34 mmol/L — ABNORMAL HIGH (ref 22–32)
Calcium: 8.4 mg/dL — ABNORMAL LOW (ref 8.9–10.3)
Chloride: 96 mmol/L — ABNORMAL LOW (ref 98–111)
Creatinine, Ser: 0.89 mg/dL (ref 0.61–1.24)
GFR calc Af Amer: >60 mL/min (ref >60)
GFR calc non Af Amer: >60 mL/min (ref >60)
Glucose, Bld: 223 mg/dL — ABNORMAL HIGH (ref 70–99)
Potassium: 4.2 mmol/L (ref 3.5–5.1)
Sodium: 138 mmol/L (ref 135–145)

## 2019-09-10 LAB — GLUCOSE, CAPILLARY
Glucose-Capillary: 277 mg/dL — ABNORMAL HIGH (ref 70–99)
Glucose-Capillary: 145 mg/dL — ABNORMAL HIGH (ref 70–99)
Glucose-Capillary: 263 mg/dL — ABNORMAL HIGH (ref 70–99)
Glucose-Capillary: 236 mg/dL — ABNORMAL HIGH (ref 70–99)
Glucose-Capillary: 283 mg/dL — ABNORMAL HIGH (ref 70–99)

## 2019-09-10 LAB — CBC
HCT: 32.6 % — ABNORMAL LOW (ref 39.0–52.0)
Hemoglobin: 10.3 g/dL — ABNORMAL LOW (ref 13.0–17.0)
MCH: 27.9 pg (ref 26.0–34.0)
MCHC: 31.6 g/dL (ref 30.0–36.0)
MCV: 88.3 fL (ref 80.0–100.0)
Platelets: 385 10*3/uL (ref 150–400)
RBC: 3.69 MIL/uL — ABNORMAL LOW (ref 4.22–5.81)
RDW: 15.1 % (ref 11.5–15.5)
WBC: 8.8 10*3/uL (ref 4.0–10.5)
nRBC: 0 % (ref 0.0–0.2)

## 2019-09-10 MED ORDER — SODIUM CHLORIDE 0.9% FLUSH: 10.0000 mL | INTRAVENOUS | Status: DC | PRN

## 2019-09-10 MED ORDER — SODIUM CHLORIDE 0.9% FLUSH
10.0000 mL | Freq: Two times a day (BID) | INTRAVENOUS | Status: DC
  Administered 2019-09-10 – 2019-09-12 (×4): 10 mL

## 2019-09-10 NOTE — Progress Notes (Signed)
PROGRESS NOTE    Gabriel Lewis  [id number] DOB: [birth year] DOA: 09/05/2019 PCP: Shon Baton, MD   Brief Narrative:  85 year old with history of CAD status post CABG in 1995, diabetes mellitus type 2, MGUS, HTN, BPH, GERD, HLD, anemia of chronic disease presented to the hospital with complains of cough, congestion and shortness of breath.  Initially noted to be hypoxic saturating 86% on room air requiring 3 L nasal cannula.  COVID-19-negative.  Diagnosed with pneumonia and started on Rocephin/azithromycin steroids.   Assessment & Plan:   Principal Problem:   CAP (community acquired pneumonia) Active Problems:   Diabetes mellitus ([place])   Hyperlipidemia   GERD (gastroesophageal reflux disease)   BPH (benign prostatic hyperplasia)   Hypertension   Anemia   Hypokalemia   Protein calorie malnutrition (HCC)   Dehydration   Acute respiratory failure with hypoxia (HCC)   Pressure injury of skin   Protein-calorie malnutrition, severe  Acute hypoxic respiratory failure secondary to pneumonia, right lower lobe diagnosed on admission -Requiring 2 L nasal cannula, saturating greater than 92%.  Will taper down oxygen.  At home does not use any oxygen -I-S/flutter valve/bronchodilators -COVID-19-negative -Continue IV Rocephin/azithromycin-we will complete 5-day course.  WBC trended down, procalcitonin negative -Check BNP -Urine strep and Legionella-negative  Mild urinary tract infection -UTI per UA collected on 09/06/2019 no urine cultures ordered previously.  Already on IV Rocephin.  Moderate dehydration -Improved with IV fluids  Hypokalemia -Stable.  Coronary artery disease status post CABG in 1995 -Currently chest pain-free.  On aspirin 81 mg daily  Diabetes mellitus type 2, hemoglobin A1c 7.3 -Sliding scale and Accu-Chek.  Metformin on hold.  Essential hypertension -Norvasc 10 mg daily  Moderate protein calorie malnutrition -Encourage oral nutrition  History of  dementia -Namenda  DVT prophylaxis: Lovenox Code Status: Full code Family Communication: Spoke with Tour manager.  Disposition Plan: Once he has been weaned off oxygen and saturating well on room air, will transition him home with home health as recommended by physical therapy.  Consultants:   None  Procedures:   None  Antimicrobials:   Rocephin  Azithromycin   Subjective: Feels ok wishes go home but still on 2L [place].  Exertional dyspnea.   Review of Systems Otherwise negative except as per HPI, including: General: Denies fever, chills, night sweats or unintended weight loss. Resp: Denies cough, wheezing, shortness of breath. Cardiac: Denies chest pain, palpitations, orthopnea, paroxysmal nocturnal dyspnea. GI: Denies abdominal pain, nausea, vomiting, diarrhea or constipation GU: Denies dysuria, frequency, hesitancy or incontinence MS: Denies muscle aches, joint pain or swelling Neuro: Denies headache, neurologic deficits (focal weakness, numbness, tingling), abnormal gait Psych: Denies anxiety, depression, SI/HI/AVH Skin: Denies new rashes or lesions ID: Denies sick contacts, exotic exposures, travel  Objective: Vitals:   09/09/19 0500 09/09/19 1540 09/09/19 1937 09/09/19 2111  BP: (!) 153/81 118/61  131/68  Pulse:  (!) 48  95  Resp:  16  16  Temp: 97.9 F (36.6 C) 98.5 F (36.9 C)  98.3 F (36.8 C)  TempSrc:  Oral  Oral  SpO2:  94% 92% 93%    Intake/Output Summary (Last 24 hours) at 09/10/2019 0751 Last data filed at 09/09/2019 1900 Gross per 24 hour  Intake 200 ml  Output --  Net 200 ml   There were no vitals filed for this visit.  Examination:  General exam: Appears calm and comfortable  Respiratory system: bibasilar PNA Cardiovascular system: S1 & S2 heard, RRR. No JVD, murmurs, rubs, gallops or clicks. No  pedal edema. Gastrointestinal system: Abdomen is nondistended, soft and nontender. No organomegaly or masses felt. Normal bowel sounds  heard. Central nervous system: Alert and oriented. No focal neurological deficits. Extremities: Symmetric 4 x 5 power. Skin: No rashes, lesions or ulcers Psychiatry: Judgement and insight appear normal. Mood & affect appropriate.   Data Reviewed:   CBC: Recent Labs  Lab 09/05/19 1338 09/06/19 0840 09/07/19 0249 09/08/19 0257 09/09/19 0209 09/10/19 0221  WBC 11.0* 10.5 12.1* 13.5* 11.7* 8.8  NEUTROABS 9.3*  --   --   --   --   --   HGB 11.9* 11.1* 11.0* 9.8* 9.2* 10.3*  HCT 38.0* 36.3* 35.5* 31.3* 29.1* 32.6*  MCV 89.8 92.1 89.9 88.4 87.9 88.3  PLT 395 344 364 342 368 0000000   Basic Metabolic Panel: Recent Labs  Lab 09/06/19 0840 09/07/19 0249 09/08/19 0257 09/09/19 0209 09/10/19 0221  NA 142 140 140 137 138  K 3.4* 3.8 4.5 4.0 4.2  CL 101 100 102 98 96*  CO2 31 28 32 31 34*  GLUCOSE 103* 235* 173* 216* 223*  BUN 25* 25* 28* 38* 32*  CREATININE 0.86 0.76 0.79 0.88 0.89  CALCIUM 8.7* 8.6* 8.2* 8.5* 8.4*  MG  --  1.5*  --   --   --    GFR: CrCl cannot be calculated (Unknown ideal weight.). Liver Function Tests: Recent Labs  Lab 09/05/19 1338  AST 11*  ALT 10  ALKPHOS 51  BILITOT 0.7  PROT 7.6  ALBUMIN 2.9*   No results for input(s): LIPASE, AMYLASE in the last 168 hours. No results for input(s): AMMONIA in the last 168 hours. Coagulation Profile: No results for input(s): INR, PROTIME in the last 168 hours. Cardiac Enzymes: No results for input(s): CKTOTAL, CKMB, CKMBINDEX, TROPONINI in the last 168 hours. BNP (last 3 results) No results for input(s): PROBNP in the last 8760 hours. HbA1C: No results for input(s): HGBA1C in the last 72 hours. CBG: Recent Labs  Lab 09/08/19 2121 09/09/19 0757 09/09/19 1207 09/09/19 1542 09/09/19 2208  GLUCAP 197* 164* 146* 199* 277*   Lipid Profile: No results for input(s): CHOL, HDL, LDLCALC, TRIG, CHOLHDL, LDLDIRECT in the last 72 hours. Thyroid Function Tests: No results for input(s): TSH, T4TOTAL, FREET4,  T3FREE, THYROIDAB in the last 72 hours. Anemia Panel: No results for input(s): VITAMINB12, FOLATE, FERRITIN, TIBC, IRON, RETICCTPCT in the last 72 hours. Sepsis Labs: Recent Labs  Lab 09/05/19 1438 09/05/19 1620  PROCALCITON  --  <0.10  LATICACIDVEN 1.7  --     Recent Results (from the past 240 hour(s))  Culture, blood (routine x 2)     Status: None (Preliminary result)   Collection Time: 09/05/19  2:50 PM   Specimen: BLOOD RIGHT ARM  Result Value Ref Range Status   Specimen Description BLOOD RIGHT ARM  Final   Special Requests   Final    BOTTLES DRAWN AEROBIC AND ANAEROBIC Blood Culture adequate volume   Culture   Final    NO GROWTH 4 DAYS Performed at [facility], [place] [address] [place], [place] [address]    Report Status PENDING  Incomplete  SARS CORONAVIRUS 2 (TAT 6-24 HRS) Nasopharyngeal Nasopharyngeal Swab     Status: None   Collection Time: 09/05/19  3:43 PM   Specimen: Nasopharyngeal Swab  Result Value Ref Range Status   SARS Coronavirus 2 NEGATIVE NEGATIVE Final    Comment: (NOTE) SARS-CoV-2 target nucleic acids are NOT DETECTED. The SARS-CoV-2 RNA is generally  detectable in upper and lower respiratory specimens during the acute phase of infection. Negative results do not preclude SARS-CoV-2 infection, do not rule out co-infections with other pathogens, and should not be used as the sole basis for treatment or other patient management decisions. Negative results must be combined with clinical observations, patient history, and epidemiological information. The expected result is Negative. Fact Sheet for Patients: SugarRoll.be Fact Sheet for Healthcare Providers: https://www.woods-mathews.com/ This test is not yet approved or cleared by the [place] FDA and  has been authorized for detection and/or diagnosis of SARS-CoV-2 by FDA under an Emergency Use Authorization (EUA). This EUA will remain  in effect  (meaning this test can be used) for the duration of the COVID-19 declaration under Section 56 4(b)(1) of the Act, 21 U.S.C. section 360bbb-3(b)(1), unless the authorization is terminated or revoked sooner. Performed at [facility], [place] [address] [place], [place] [address]   Culture, blood (routine x 2)     Status: None (Preliminary result)   Collection Time: 09/05/19  4:24 PM   Specimen: BLOOD RIGHT HAND  Result Value Ref Range Status   Specimen Description BLOOD RIGHT HAND  Final   Special Requests   Final    BOTTLES DRAWN AEROBIC AND ANAEROBIC Blood Culture adequate volume   Culture   Final    NO GROWTH 4 DAYS Performed at [facility], [place] [address] [place], [place] [address]    Report Status PENDING  Incomplete         Radiology Studies: No results found.      Scheduled Meds:  amLODipine  10 mg Oral Q breakfast   aspirin EC  81 mg Oral Daily   azithromycin  500 mg Oral Daily   cholecalciferol  2,000 Units Oral Q breakfast   enoxaparin (LOVENOX) injection  40 mg Subcutaneous Q24H   feeding supplement (ENSURE ENLIVE)  237 mL Oral BID BM   guaiFENesin-dextromethorphan  10 mL Oral Q6H   insulin aspart  0-15 Units Subcutaneous TID WC   insulin aspart  0-5 Units Subcutaneous QHS   lactobacillus acidophilus  2 tablet Oral TID   levalbuterol  1.25 mg Nebulization TID   memantine  10 mg Oral QHS   pantoprazole  40 mg Oral Daily   predniSONE  50 mg Oral Q breakfast   tamsulosin  0.4 mg Oral Q breakfast   Continuous Infusions:  cefTRIAXone (ROCEPHIN)  IV Stopped (09/09/19 1900)     LOS: 5 days   Time spent= 25 mins    Dariya Gainer Arsenio Loader, MD [facility]  If 7PM-7AM, please contact night-coverage  09/10/2019, 7:51 AM

## 2019-09-11 ENCOUNTER — Inpatient Hospital Stay (HOSPITAL_COMMUNITY): Payer: Medicare Other

## 2019-09-11 DIAGNOSIS — R0602 Shortness of breath: Secondary | ICD-10-CM

## 2019-09-11 LAB — ECHOCARDIOGRAM COMPLETE

## 2019-09-11 LAB — BASIC METABOLIC PANEL
Anion gap: 8 (ref 5–15)
BUN: 30 mg/dL — ABNORMAL HIGH (ref 8–23)
CO2: 38 mmol/L — ABNORMAL HIGH (ref 22–32)
Calcium: 8.5 mg/dL — ABNORMAL LOW (ref 8.9–10.3)
Chloride: 91 mmol/L — ABNORMAL LOW (ref 98–111)
Creatinine, Ser: 0.87 mg/dL (ref 0.61–1.24)
GFR calc Af Amer: >60 mL/min (ref >60)
GFR calc non Af Amer: >60 mL/min (ref >60)
Glucose, Bld: 195 mg/dL — ABNORMAL HIGH (ref 70–99)
Potassium: 4.4 mmol/L (ref 3.5–5.1)
Sodium: 137 mmol/L (ref 135–145)

## 2019-09-11 LAB — CBC
HCT: 31.7 % — ABNORMAL LOW (ref 39.0–52.0)
Hemoglobin: 10.3 g/dL — ABNORMAL LOW (ref 13.0–17.0)
MCH: 28.2 pg (ref 26.0–34.0)
MCHC: 32.5 g/dL (ref 30.0–36.0)
MCV: 86.8 fL (ref 80.0–100.0)
Platelets: 407 10*3/uL — ABNORMAL HIGH (ref 150–400)
RBC: 3.65 MIL/uL — ABNORMAL LOW (ref 4.22–5.81)
RDW: 15 % (ref 11.5–15.5)
WBC: 9.2 10*3/uL (ref 4.0–10.5)
nRBC: 0 % (ref 0.0–0.2)

## 2019-09-11 LAB — BRAIN NATRIURETIC PEPTIDE: B Natriuretic Peptide: 209.6 pg/mL — ABNORMAL HIGH (ref 0.0–100.0)

## 2019-09-11 LAB — GLUCOSE, CAPILLARY
Glucose-Capillary: 129 mg/dL — ABNORMAL HIGH (ref 70–99)
Glucose-Capillary: 197 mg/dL — ABNORMAL HIGH (ref 70–99)
Glucose-Capillary: 257 mg/dL — ABNORMAL HIGH (ref 70–99)
Glucose-Capillary: 290 mg/dL — ABNORMAL HIGH (ref 70–99)

## 2019-09-11 LAB — MAGNESIUM: Magnesium: 1.6 mg/dL — ABNORMAL LOW (ref 1.7–2.4)

## 2019-09-11 MED ORDER — LEVALBUTEROL HCL 1.25 MG/0.5ML IN NEBU
1.2500 mg | INHALATION_SOLUTION | Freq: Four times a day (QID) | RESPIRATORY_TRACT | Status: DC
  Administered 2019-09-11 (×2): 1.25 mg via RESPIRATORY_TRACT
  Filled 2019-09-11 (×2): qty 0.5

## 2019-09-11 MED ORDER — PREDNISONE 20 MG PO TABS
40.0000 mg | ORAL_TABLET | Freq: Every day | ORAL | Status: DC
  Administered 2019-09-11 – 2019-09-12 (×2): 40 mg via ORAL
  Filled 2019-09-11 (×3): qty 2

## 2019-09-11 MED ORDER — LEVALBUTEROL HCL 1.25 MG/0.5ML IN NEBU
1.2500 mg | INHALATION_SOLUTION | Freq: Three times a day (TID) | RESPIRATORY_TRACT | Status: DC
  Administered 2019-09-12: 1.25 mg via RESPIRATORY_TRACT
  Filled 2019-09-11: qty 0.5

## 2019-09-11 MED ORDER — AZITHROMYCIN 250 MG PO TABS
500.0000 mg | ORAL_TABLET | Freq: Every day | ORAL | Status: AC
  Administered 2019-09-11: 500 mg via ORAL
  Filled 2019-09-11: qty 2

## 2019-09-11 MED ORDER — FUROSEMIDE 10 MG/ML IJ SOLN
40.0000 mg | Freq: Two times a day (BID) | INTRAMUSCULAR | Status: DC
  Administered 2019-09-11 (×2): 40 mg via INTRAVENOUS
  Filled 2019-09-11 (×2): qty 4

## 2019-09-11 MED ORDER — MAGNESIUM OXIDE 400 (241.3 MG) MG PO TABS
800.0000 mg | ORAL_TABLET | ORAL | Status: AC
  Administered 2019-09-11 (×2): 800 mg via ORAL
  Filled 2019-09-11 (×2): qty 2

## 2019-09-11 MED ORDER — IPRATROPIUM BROMIDE 0.02 % IN SOLN
0.5000 mg | Freq: Three times a day (TID) | RESPIRATORY_TRACT | Status: DC
  Administered 2019-09-12: 0.5 mg via RESPIRATORY_TRACT
  Filled 2019-09-11: qty 2.5

## 2019-09-11 MED ORDER — IPRATROPIUM BROMIDE 0.02 % IN SOLN
0.5000 mg | Freq: Four times a day (QID) | RESPIRATORY_TRACT | Status: DC
  Administered 2019-09-11 (×2): 0.5 mg via RESPIRATORY_TRACT
  Filled 2019-09-11 (×2): qty 2.5

## 2019-09-11 NOTE — Progress Notes (Signed)
  Echocardiogram 2D Echocardiogram has been performed.  Gabriel Lewis 09/11/2019, 1:59 PM

## 2019-09-11 NOTE — Progress Notes (Signed)
Patient was taken off O2 to see if O2 sats would decrease (at 0600). Reassessed O2 at 0700 and O2 sats were 89%, patient was placed back on 1LNC and O2 improved to 93%.

## 2019-09-11 NOTE — Plan of Care (Signed)

## 2019-09-11 NOTE — Progress Notes (Signed)
PROGRESS NOTE    Gabriel Lewis  [id number] DOB: 1932/04/24 DOA: 09/05/2019 PCP: Shon Baton, MD   Brief Narrative:  85 year old with history of CAD status post CABG in 1995, diabetes mellitus type 2, MGUS, HTN, BPH, GERD, HLD, anemia of chronic disease presented to the hospital with complains of cough, congestion and shortness of breath.  Initially noted to be hypoxic saturating 86% on room air requiring 3 L nasal cannula.  COVID-19-negative.  Diagnosed with pneumonia and started on Rocephin/azithromycin steroids.   Assessment & Plan:   Principal Problem:   CAP (community acquired pneumonia) Active Problems:   Diabetes mellitus ([place])   Hyperlipidemia   GERD (gastroesophageal reflux disease)   BPH (benign prostatic hyperplasia)   Hypertension   Anemia   Hypokalemia   Protein calorie malnutrition (HCC)   Dehydration   Acute respiratory failure with hypoxia (HCC)   Pressure injury of skin   Protein-calorie malnutrition, severe  Acute hypoxic respiratory failure secondary to pneumonia, right lower lobe diagnosed on admission -Requiring 2 L nasal cannula, saturating greater than 92%.  Will taper down oxygen.  At home does not use any oxygen -I-S/flutter valve/bronchodilators -COVID-19-negative -Procalcitonin negative, stop antibiotics. -Prednisone 40 mg X 5 days.  Day 1/5 -Urine strep and Legionella-negative -Check echocardiogram.  Lasix 40 mg IV twice daily.  Mild urinary tract infection -UTI per UA collected on 09/06/2019 no urine cultures ordered previously.  Completed Rocephin course  Moderate dehydration -Improved with IV fluids  Hypokalemia -Stable.  Coronary artery disease status post CABG in 1995 -Currently chest pain-free.  On aspirin 81 mg daily  Diabetes mellitus type 2, hemoglobin A1c 7.3 -Sliding scale and Accu-Chek.  Metformin on hold.  Essential hypertension -Norvasc 10 mg daily  Moderate protein calorie malnutrition -Encourage oral nutrition   History of dementia -Namenda  DVT prophylaxis: Lovenox Code Status: Full code Family Communication: Lamarr Lulas today, no answer- left a voicemail. Last updated 12/5 by me.   Disposition Plan: Still diffusely diminished breath sounds requiring 2 L nasal cannula.  Shallow breathing with exertional dyspnea.  Unsafe for discharge.  Consultants:   None  Procedures:   None  Antimicrobials:   Rocephin  Azithromycin   Subjective: Patient wishes to go home but he clearly gets dyspneic with minimal exertion.  On incentive spirometer, he can only go up to maximum 500cc.  Still on 2 L nasal cannula  Review of Systems Otherwise negative except as per HPI, including: General = no fevers, chills, dizziness, malaise, fatigue HEENT/EYES = negative for pain, redness, loss of vision, double vision, blurred vision, loss of hearing, sore throat, hoarseness, dysphagia Cardiovascular= negative for chest pain, palpitation, murmurs, lower extremity swelling Respiratory/lungs= negative for hemoptysis, wheezing, mucus production Gastrointestinal= negative for nausea, vomiting,, abdominal pain, melena, hematemesis Genitourinary= negative for Dysuria, Hematuria, Change in Urinary Frequency MSK = Negative for arthralgia, myalgias, Back Pain, Joint swelling  Neurology= Negative for headache, seizures, numbness, tingling  Psychiatry= Negative for anxiety, depression, suicidal and homocidal ideation Allergy/Immunology= Medication/Food allergy as listed  Skin= Negative for Rash, lesions, ulcers, itching   Objective: Vitals:   09/11/19 0700 09/11/19 0701 09/11/19 0815 09/11/19 0906  BP:   (!) 157/75   Pulse:   86 86  Resp:    18  Temp:   97.7 F (36.5 C)   TempSrc:   Oral   SpO2: (!) 89% 93% 98% 97%    Intake/Output Summary (Last 24 hours) at 09/11/2019 1020 Last data filed at 09/11/2019 0200 Gross per 24 hour  Intake 110 ml  Output 1800 ml  Net -1690 ml   There were no vitals filed for  this visit.  Examination:  Constitutional: Not in acute distress, 2 L nasal cannula Respiratory: Diffuse diminished breath sounds Cardiovascular: Normal sinus rhythm, no rubs Abdomen: Nontender nondistended good bowel sounds Musculoskeletal: No edema noted Skin: No rashes seen Neurologic: CN 2-12 grossly intact.  And nonfocal Psychiatric: Poor judgment and insight  Data Reviewed:   CBC: Recent Labs  Lab 09/05/19 1338  09/07/19 0249 09/08/19 0257 09/09/19 0209 09/10/19 0221 09/11/19 0159  WBC 11.0*   < > 12.1* 13.5* 11.7* 8.8 9.2  NEUTROABS 9.3*  --   --   --   --   --   --   HGB 11.9*   < > 11.0* 9.8* 9.2* 10.3* 10.3*  HCT 38.0*   < > 35.5* 31.3* 29.1* 32.6* 31.7*  MCV 89.8   < > 89.9 88.4 87.9 88.3 86.8  PLT 395   < > 364 342 368 385 407*   < > = values in this interval not displayed.   Basic Metabolic Panel: Recent Labs  Lab 09/07/19 0249 09/08/19 0257 09/09/19 0209 09/10/19 0221 09/11/19 0159  NA 140 140 137 138 137  K 3.8 4.5 4.0 4.2 4.4  CL 100 102 98 96* 91*  CO2 28 32 31 34* 38*  GLUCOSE 235* 173* 216* 223* 195*  BUN 25* 28* 38* 32* 30*  CREATININE 0.76 0.79 0.88 0.89 0.87  CALCIUM 8.6* 8.2* 8.5* 8.4* 8.5*  MG 1.5*  --   --   --  1.6*   GFR: CrCl cannot be calculated (Unknown ideal weight.). Liver Function Tests: Recent Labs  Lab 09/05/19 1338  AST 11*  ALT 10  ALKPHOS 51  BILITOT 0.7  PROT 7.6  ALBUMIN 2.9*   No results for input(s): LIPASE, AMYLASE in the last 168 hours. No results for input(s): AMMONIA in the last 168 hours. Coagulation Profile: No results for input(s): INR, PROTIME in the last 168 hours. Cardiac Enzymes: No results for input(s): CKTOTAL, CKMB, CKMBINDEX, TROPONINI in the last 168 hours. BNP (last 3 results) No results for input(s): PROBNP in the last 8760 hours. HbA1C: No results for input(s): HGBA1C in the last 72 hours. CBG: Recent Labs  Lab 09/09/19 2208 09/10/19 0754 09/10/19 1240 09/10/19 1701 09/10/19  2152  GLUCAP 277* 145* 263* 236* 283*   Lipid Profile: No results for input(s): CHOL, HDL, LDLCALC, TRIG, CHOLHDL, LDLDIRECT in the last 72 hours. Thyroid Function Tests: No results for input(s): TSH, T4TOTAL, FREET4, T3FREE, THYROIDAB in the last 72 hours. Anemia Panel: No results for input(s): VITAMINB12, FOLATE, FERRITIN, TIBC, IRON, RETICCTPCT in the last 72 hours. Sepsis Labs: Recent Labs  Lab 09/05/19 1438 09/05/19 1620  PROCALCITON  --  <0.10  LATICACIDVEN 1.7  --     Recent Results (from the past 240 hour(s))  Culture, blood (routine x 2)     Status: None   Collection Time: 09/05/19  2:50 PM   Specimen: BLOOD RIGHT ARM  Result Value Ref Range Status   Specimen Description BLOOD RIGHT ARM  Final   Special Requests   Final    BOTTLES DRAWN AEROBIC AND ANAEROBIC Blood Culture adequate volume   Culture   Final    NO GROWTH 5 DAYS Performed at [facility], [address], [place], [place] [address]    Report Status 09/10/2019 FINAL  Final  SARS CORONAVIRUS 2 (TAT 6-24 HRS) Nasopharyngeal Nasopharyngeal  Swab     Status: None   Collection Time: 09/05/19  3:43 PM   Specimen: Nasopharyngeal Swab  Result Value Ref Range Status   SARS Coronavirus 2 NEGATIVE NEGATIVE Final    Comment: (NOTE) SARS-CoV-2 target nucleic acids are NOT DETECTED. The SARS-CoV-2 RNA is generally detectable in upper and lower respiratory specimens during the acute phase of infection. Negative results do not preclude SARS-CoV-2 infection, do not rule out co-infections with other pathogens, and should not be used as the sole basis for treatment or other patient management decisions. Negative results must be combined with clinical observations, patient history, and epidemiological information. The expected result is Negative. Fact Sheet for Patients: SugarRoll.be Fact Sheet for Healthcare Providers: https://www.woods-mathews.com/ This test is not yet  approved or cleared by the [place] FDA and  has been authorized for detection and/or diagnosis of SARS-CoV-2 by FDA under an Emergency Use Authorization (EUA). This EUA will remain  in effect (meaning this test can be used) for the duration of the COVID-19 declaration under Section 56 4(b)(1) of the Act, 21 U.S.C. section 360bbb-3(b)(1), unless the authorization is terminated or revoked sooner. Performed at [facility], [place] [address] [place], [place] [address]   Culture, blood (routine x 2)     Status: None   Collection Time: 09/05/19  4:24 PM   Specimen: BLOOD RIGHT HAND  Result Value Ref Range Status   Specimen Description BLOOD RIGHT HAND  Final   Special Requests   Final    BOTTLES DRAWN AEROBIC AND ANAEROBIC Blood Culture adequate volume   Culture   Final    NO GROWTH 5 DAYS Performed at [facility], [place] [address], [place], [place] [address]    Report Status 09/10/2019 FINAL  Final         Radiology Studies: No results found.      Scheduled Meds: . amLODipine  10 mg Oral Q breakfast  . aspirin EC  81 mg Oral Daily  . cholecalciferol  2,000 Units Oral Q breakfast  . enoxaparin (LOVENOX) injection  40 mg Subcutaneous Q24H  . feeding supplement (ENSURE ENLIVE)  237 mL Oral BID BM  . guaiFENesin-dextromethorphan  10 mL Oral Q6H  . insulin aspart  0-15 Units Subcutaneous TID WC  . insulin aspart  0-5 Units Subcutaneous QHS  . lactobacillus acidophilus  2 tablet Oral TID  . levalbuterol  1.25 mg Nebulization TID  . memantine  10 mg Oral QHS  . pantoprazole  40 mg Oral Daily  . sodium chloride flush  10-40 mL Intracatheter Q12H  . tamsulosin  0.4 mg Oral Q breakfast   Continuous Infusions:    LOS: 6 days   Time spent= 25 mins    Ankit Arsenio Loader, MD [facility]  If 7PM-7AM, please contact night-coverage  09/11/2019, 10:20 AM

## 2019-09-12 LAB — BASIC METABOLIC PANEL
Anion gap: 11 (ref 5–15)
BUN: 37 mg/dL — ABNORMAL HIGH (ref 8–23)
CO2: 39 mmol/L — ABNORMAL HIGH (ref 22–32)
Calcium: 8.7 mg/dL — ABNORMAL LOW (ref 8.9–10.3)
Chloride: 86 mmol/L — ABNORMAL LOW (ref 98–111)
Creatinine, Ser: 1.02 mg/dL (ref 0.61–1.24)
GFR calc Af Amer: >60 mL/min (ref >60)
GFR calc non Af Amer: >60 mL/min (ref >60)
Glucose, Bld: 188 mg/dL — ABNORMAL HIGH (ref 70–99)
Potassium: 4.3 mmol/L (ref 3.5–5.1)
Sodium: 136 mmol/L (ref 135–145)

## 2019-09-12 LAB — MAGNESIUM: Magnesium: 1.7 mg/dL (ref 1.7–2.4)

## 2019-09-12 LAB — GLUCOSE, CAPILLARY: Glucose-Capillary: 144 mg/dL — ABNORMAL HIGH (ref 70–99)

## 2019-09-12 MED ORDER — PREDNISONE 20 MG PO TABS: 40.0000 mg | ORAL_TABLET | Freq: Every day | ORAL | 0 refills | Status: AC

## 2019-09-12 NOTE — Care Management Important Message (Signed)
Important Message  Patient Details  Name: Gabriel Lewis MRN: [id number] Date of Birth: 03-17-1932   Medicare Important Message Given:  Yes     [name] [name] 09/12/2019, 4:37 PM

## 2019-09-12 NOTE — Discharge Summary (Signed)
Physician Discharge Summary  Gabriel Lewis [id number] DOB: 01-23-1932 DOA: 09/05/2019  PCP: Shon Baton, MD  Admit date: 09/05/2019 Discharge date: 09/12/2019  Admitted From: Home Disposition: Home  Recommendations for Outpatient Follow-up:  1. Follow up with PCP in 1-2 weeks 2. Please obtain BMP/CBC in one week your next doctors visit.  3. Full days of oral prednisone  Home Health: PT/OT/RN Equipment/Devices: Walker Discharge Condition: Stable CODE STATUS: Full Diet recommendation: Heart healthy/diabetic  Brief/Interim Summary: 85 year old with history of CAD status post CABG in 1995, diabetes mellitus type 2, MGUS, HTN, BPH, GERD, HLD, anemia of chronic disease presented to the hospital with complains of cough, congestion and shortness of breath.  Initially noted to be hypoxic saturating 86% on room air requiring 3 L nasal cannula.  COVID-19-negative.  Diagnosed with pneumonia and started on Rocephin/azithromycin steroids.  He was also diagnosed with urinary tract infection.  Hospital course complicated by fluid overload therefore received Lasix.  Over the course of several days his symptoms significantly improved and appeared more euvolemic.  He was ambulating in the hallway saturating greater than 90% on room air without shortness of breath.  He really wished to go home therefore he was discharged home in stable condition. Spoke with the patient's daughter and wife.  Recommended that they see primary care physician outside for possible MMSE to categorize his dementia.  I recommended against driving at this time.   Discharge Diagnoses:  Principal Problem:   CAP (community acquired pneumonia) Active Problems:   Diabetes mellitus ([place])   Hyperlipidemia   GERD (gastroesophageal reflux disease)   BPH (benign prostatic hyperplasia)   Hypertension   Anemia   Hypokalemia   Protein calorie malnutrition (HCC)   Dehydration   Acute respiratory failure with hypoxia (HCC)   Pressure  injury of skin   Protein-calorie malnutrition, severe  Acute hypoxic respiratory failure secondary to pneumonia, right lower lobe diagnosed on admission Bibasilar atelectasis -Resolved.  Now saturating greater than 90% on room air with ambulation.  Completed antibiotic course.  We will give him 4 more days of oral prednisone.  Procalcitonin is negative.  Urine strep and Legionella-negative.  Incentive spirometer and flutter valve -Echo-ejection fraction 55%.  Mild urinary tract infection -Completed antibiotic course  Moderate dehydration -Resolved.  Euvolemic  Hypokalemia -Stable.  Coronary artery disease status post CABG in 1995 -Currently chest pain-free.  On aspirin 81 mg daily  Diabetes mellitus type 2, hemoglobin A1c 7.3 -Resume home Metformin  Essential hypertension -Norvasc 10 mg daily  Moderate protein calorie malnutrition -Encourage oral nutrition  History of dementia -Namenda.  Would recommend outpatient repeat MMSE.  Would recommend against driving.  New generalized weakness Home a little arrangements have been made-PT/OT/RN.  Consultations:  None  Subjective: No complaints, saturating greater than 90% with ambulation on room air.  Really wants to go home.  His care was extensively discussed with his wife and his daughter.  Discharge Exam: Vitals:   09/12/19 0723 09/12/19 0813  BP:  126/74  Pulse:  87  Resp:  20  Temp:  98 F (36.7 C)  SpO2: 95% 92%   Vitals:   09/11/19 2253 09/12/19 0400 09/12/19 0723 09/12/19 0813  BP: (!) 121/54 (!) 112/55  126/74  Pulse: 95 65  87  Resp: 18 18  20   Temp: 98.2 F (36.8 C) 98.4 F (36.9 C)  98 F (36.7 C)  TempSrc: Oral Oral    SpO2: 95% 96% 95% 92%    General: Pt is alert, awake,  not in acute distress Cardiovascular: RRR, S1/S2 +, no rubs, no gallops Respiratory: Minimal bibasilar diminished breath sounds. Abdominal: Soft, NT, ND, bowel sounds + Extremities: no edema, no cyanosis  Discharge  Instructions   Allergies as of 09/12/2019      Reactions   Statins Other (See Comments)   myalgias      Medication List    TAKE these medications   acetaminophen 500 MG tablet Commonly known as: TYLENOL Take 1,000 mg by mouth every 6 (six) hours as needed (pain).   alendronate 70 MG tablet Commonly known as: FOSAMAX Take 70 mg by mouth every Monday.   amLODipine 10 MG tablet Commonly known as: NORVASC Take 10 mg by mouth daily with breakfast.   aspirin EC 81 MG tablet Take 1 tablet (81 mg total) by mouth daily. What changed: when to take this   Flomax 0.4 MG Caps capsule Generic drug: tamsulosin Take 0.4 mg by mouth daily with breakfast.   memantine 10 MG tablet Commonly known as: NAMENDA Take 10 mg by mouth at bedtime.   metFORMIN 500 MG tablet Commonly known as: GLUCOPHAGE Take 500 mg by mouth daily with breakfast.   Myrbetriq 50 MG Tb24 tablet Generic drug: mirabegron ER Take 50 mg by mouth daily with breakfast.   omeprazole 20 MG capsule Commonly known as: PRILOSEC Take 20 mg by mouth daily with breakfast.   predniSONE 20 MG tablet Commonly known as: DELTASONE Take 2 tablets (40 mg total) by mouth daily with breakfast for 4 days. Start taking on: September 13, 2019   STOOL SOFTENER PO Take 1 capsule by mouth daily as needed (constipation).   Vitamin D3 50 MCG (2000 UT) Tabs Take 2,000 Units by mouth daily with breakfast.      Follow-up Information    [facility] Follow up.   Why: home health service arranged         Allergies  Allergen Reactions  . Statins Other (See Comments)    myalgias    You were cared for by a hospitalist during your hospital stay. If you have any questions about your discharge medications or the care you received while you were in the hospital after you are discharged, you can call the unit and asked to speak with the hospitalist on call if the hospitalist that took care of you is not available. Once you are  discharged, your primary care physician will handle any further medical issues. Please note that no refills for any discharge medications will be authorized once you are discharged, as it is imperative that you return to your primary care physician (or establish a relationship with a primary care physician if you do not have one) for your aftercare needs so that they can reassess your need for medications and monitor your lab values.   Procedures/Studies: Dg Chest 2 View  Result Date: 09/05/2019 CLINICAL DATA:  Shortness of breath EXAM: CHEST - 2 VIEW COMPARISON:  08/23/2008 FINDINGS: Prior CABG changes. Heart size appears mildly enlarged although is largely obscured. Patchy right lower lobe airspace opacity. Left lung appears clear. No pleural effusion or pneumothorax. Exaggerated thoracic kyphosis. IMPRESSION: 1. Patchy airspace opacity at the right lung base is suspicious for pneumonia. 2. Mild cardiomegaly. Electronically Signed   By: Davina Poke M.D.   On: 09/05/2019 14:15   Dg Chest Port 1 View  Result Date: 09/11/2019 CLINICAL DATA:  Dyspnea, history diabetes mellitus, hypertension, coronary artery disease EXAM: PORTABLE CHEST 1 VIEW COMPARISON:  Portable exam [phone]  hours compared to 09/05/2019 FINDINGS: Slightly rotated to the RIGHT. Volume loss in RIGHT hemithorax. Upper normal size of cardiac silhouette post CABG. Atherosclerotic calcification aorta. BILATERAL lower lobe opacification could represent atelectasis or consolidation, increased from previous exam Upper lungs clear. Small RIGHT pleural effusion. No pneumothorax. Bones demineralized. IMPRESSION: Bibasilar atelectasis versus consolidation, greater on RIGHT, with small RIGHT pleural effusion. Electronically Signed   By: Lavonia Dana M.D.   On: 09/11/2019 14:31      The results of significant diagnostics from this hospitalization (including imaging, microbiology, ancillary and laboratory) are listed below for reference.      Microbiology: Recent Results (from the past 240 hour(s))  Culture, blood (routine x 2)     Status: None   Collection Time: 09/05/19  2:50 PM   Specimen: BLOOD RIGHT ARM  Result Value Ref Range Status   Specimen Description BLOOD RIGHT ARM  Final   Special Requests   Final    BOTTLES DRAWN AEROBIC AND ANAEROBIC Blood Culture adequate volume   Culture   Final    NO GROWTH 5 DAYS Performed at [facility], [address] [place], [place] [address]    Report Status 09/10/2019 FINAL  Final  SARS CORONAVIRUS 2 (TAT 6-24 HRS) Nasopharyngeal Nasopharyngeal Swab     Status: None   Collection Time: 09/05/19  3:43 PM   Specimen: Nasopharyngeal Swab  Result Value Ref Range Status   SARS Coronavirus 2 NEGATIVE NEGATIVE Final    Comment: (NOTE) SARS-CoV-2 target nucleic acids are NOT DETECTED. The SARS-CoV-2 RNA is generally detectable in upper and lower respiratory specimens during the acute phase of infection. Negative results do not preclude SARS-CoV-2 infection, do not rule out co-infections with other pathogens, and should not be used as the sole basis for treatment or other patient management decisions. Negative results must be combined with clinical observations, patient history, and epidemiological information. The expected result is Negative. Fact Sheet for Patients: SugarRoll.be Fact Sheet for Healthcare Providers: https://www.woods-mathews.com/ This test is not yet approved or cleared by the [place] FDA and  has been authorized for detection and/or diagnosis of SARS-CoV-2 by FDA under an Emergency Use Authorization (EUA). This EUA will remain  in effect (meaning this test can be used) for the duration of the COVID-19 declaration under Section 56 4(b)(1) of the Act, 21 U.S.C. section 360bbb-3(b)(1), unless the authorization is terminated or revoked sooner. Performed at [facility], [place] [address] [place],  [place] [address]   Culture, blood (routine x 2)     Status: None   Collection Time: 09/05/19  4:24 PM   Specimen: BLOOD RIGHT HAND  Result Value Ref Range Status   Specimen Description BLOOD RIGHT HAND  Final   Special Requests   Final    BOTTLES DRAWN AEROBIC AND ANAEROBIC Blood Culture adequate volume   Culture   Final    NO GROWTH 5 DAYS Performed at [facility], [place] [address], [place], [place] [address]    Report Status 09/10/2019 FINAL  Final     Labs: BNP (last 3 results) Recent Labs    09/11/19 0159  BNP [id number]*   Basic Metabolic Panel: Recent Labs  Lab 09/07/19 0249 09/08/19 0257 09/09/19 0209 09/10/19 0221 09/11/19 0159 09/12/19 0250  NA 140 140 137 138 137 136  K 3.8 4.5 4.0 4.2 4.4 4.3  CL 100 102 98 96* 91* 86*  CO2 28 32 31 34* 38* 39*  GLUCOSE 235* 173* 216*  223* 195* 188*  BUN 25* 28* 38* 32* 30* 37*  CREATININE 0.76 0.79 0.88 0.89 0.87 1.02  CALCIUM 8.6* 8.2* 8.5* 8.4* 8.5* 8.7*  MG 1.5*  --   --   --  1.6* 1.7   Liver Function Tests: Recent Labs  Lab 09/05/19 1338  AST 11*  ALT 10  ALKPHOS 51  BILITOT 0.7  PROT 7.6  ALBUMIN 2.9*   No results for input(s): LIPASE, AMYLASE in the last 168 hours. No results for input(s): AMMONIA in the last 168 hours. CBC: Recent Labs  Lab 09/05/19 1338  09/07/19 0249 09/08/19 0257 09/09/19 0209 09/10/19 0221 09/11/19 0159  WBC 11.0*   < > 12.1* 13.5* 11.7* 8.8 9.2  NEUTROABS 9.3*  --   --   --   --   --   --   HGB 11.9*   < > 11.0* 9.8* 9.2* 10.3* 10.3*  HCT 38.0*   < > 35.5* 31.3* 29.1* 32.6* 31.7*  MCV 89.8   < > 89.9 88.4 87.9 88.3 86.8  PLT 395   < > 364 342 368 385 407*   < > = values in this interval not displayed.   Cardiac Enzymes: No results for input(s): CKTOTAL, CKMB, CKMBINDEX, TROPONINI in the last 168 hours. BNP: Invalid input(s): POCBNP CBG: Recent Labs  Lab 09/11/19 0815 09/11/19 1139 09/11/19 1700 09/11/19 2108 09/12/19 0811  GLUCAP 129* 197* 257* 290* 144*    D-Dimer No results for input(s): DDIMER in the last 72 hours. Hgb A1c No results for input(s): HGBA1C in the last 72 hours. Lipid Profile No results for input(s): CHOL, HDL, LDLCALC, TRIG, CHOLHDL, LDLDIRECT in the last 72 hours. Thyroid function studies No results for input(s): TSH, T4TOTAL, T3FREE, THYROIDAB in the last 72 hours.  Invalid input(s): FREET3 Anemia work up No results for input(s): VITAMINB12, FOLATE, FERRITIN, TIBC, IRON, RETICCTPCT in the last 72 hours. Urinalysis    Component Value Date/Time   COLORURINE YELLOW 09/06/2019 0916   APPEARANCEUR HAZY (A) 09/06/2019 0916   LABSPEC 1.017 09/06/2019 0916   PHURINE 6.0 09/06/2019 0916   GLUCOSEU NEGATIVE 09/06/2019 0916   HGBUR SMALL (A) 09/06/2019 0916   BILIRUBINUR NEGATIVE 09/06/2019 0916   KETONESUR 5 (A) 09/06/2019 0916   PROTEINUR 100 (A) 09/06/2019 0916   UROBILINOGEN 1.0 08/24/2008 0026   NITRITE NEGATIVE 09/06/2019 0916   LEUKOCYTESUR MODERATE (A) 09/06/2019 0916   Sepsis Labs Invalid input(s): PROCALCITONIN,  WBC,  LACTICIDVEN Microbiology Recent Results (from the past 240 hour(s))  Culture, blood (routine x 2)     Status: None   Collection Time: 09/05/19  2:50 PM   Specimen: BLOOD RIGHT ARM  Result Value Ref Range Status   Specimen Description BLOOD RIGHT ARM  Final   Special Requests   Final    BOTTLES DRAWN AEROBIC AND ANAEROBIC Blood Culture adequate volume   Culture   Final    NO GROWTH 5 DAYS Performed at [facility], [place] [address], [place], [place] [address]    Report Status 09/10/2019 FINAL  Final  SARS CORONAVIRUS 2 (TAT 6-24 HRS) Nasopharyngeal Nasopharyngeal Swab     Status: None   Collection Time: 09/05/19  3:43 PM   Specimen: Nasopharyngeal Swab  Result Value Ref Range Status   SARS Coronavirus 2 NEGATIVE NEGATIVE Final    Comment: (NOTE) SARS-CoV-2 target nucleic acids are NOT DETECTED. The SARS-CoV-2 RNA is generally detectable in upper and lower respiratory specimens  during the acute phase of infection. Negative  results do not preclude SARS-CoV-2 infection, do not rule out co-infections with other pathogens, and should not be used as the sole basis for treatment or other patient management decisions. Negative results must be combined with clinical observations, patient history, and epidemiological information. The expected result is Negative. Fact Sheet for Patients: SugarRoll.be Fact Sheet for Healthcare Providers: https://www.woods-mathews.com/ This test is not yet approved or cleared by the [place] FDA and  has been authorized for detection and/or diagnosis of SARS-CoV-2 by FDA under an Emergency Use Authorization (EUA). This EUA will remain  in effect (meaning this test can be used) for the duration of the COVID-19 declaration under Section 56 4(b)(1) of the Act, 21 U.S.C. section 360bbb-3(b)(1), unless the authorization is terminated or revoked sooner. Performed at [facility], [place] [address] [place], [place] [address]   Culture, blood (routine x 2)     Status: None   Collection Time: 09/05/19  4:24 PM   Specimen: BLOOD RIGHT HAND  Result Value Ref Range Status   Specimen Description BLOOD RIGHT HAND  Final   Special Requests   Final    BOTTLES DRAWN AEROBIC AND ANAEROBIC Blood Culture adequate volume   Culture   Final    NO GROWTH 5 DAYS Performed at [facility], [place] [address] [place], [place] [address]    Report Status 09/10/2019 FINAL  Final     Time coordinating discharge:  I have spent 35 minutes face to face with the patient and on the ward discussing the patients care, assessment, plan and disposition with other care givers. >50% of the time was devoted counseling the patient about the risks and benefits of treatment/Discharge disposition and coordinating care.   SIGNED:   Damita Lack, MD  [facility] 09/12/2019, 10:35 AM   If 7PM-7AM, please  contact night-coverage

## 2019-09-12 NOTE — Progress Notes (Signed)
SATURATION QUALIFICATIONS: (This note is used to comply with regulatory documentation for home oxygen)  Patient Saturations on Room Air at Rest = 95%  Patient Saturations on Room Air while Ambulating = 88-91%  Patient Saturations on 0 Liters of oxygen while Ambulating = 88-91%  Patient walked 64ft

## 2020-05-11 ENCOUNTER — Telehealth: Payer: Self-pay | Admitting: Cardiology

## 2020-05-11 NOTE — Telephone Encounter (Signed)
LVM for patient to return call to get scheduled with [place] from recall list 

## 2020-07-04 ENCOUNTER — Telehealth: Payer: Self-pay | Admitting: Cardiology

## 2020-07-04 NOTE — Telephone Encounter (Signed)
Spoke with patient wife about rescheduling follow up visit with [place] from recall list, stated patient no longer needed follow up which is why the previous appointment was canceled

## 2020-07-10 ENCOUNTER — Inpatient Hospital Stay: Payer: Medicare Other | Attending: Internal Medicine

## 2020-07-17 ENCOUNTER — Encounter: Payer: Self-pay | Admitting: Hematology and Oncology

## 2020-07-17 ENCOUNTER — Inpatient Hospital Stay: Payer: Medicare Other | Admitting: Hematology and Oncology

## 2020-07-25 ENCOUNTER — Ambulatory Visit: Payer: Medicare Other | Admitting: Cardiology

## 2021-01-04 DEATH — deceased
